# Patient Record
Sex: Female | Born: 1955 | Race: White | Hispanic: No | Smoking: Former smoker
Health system: Southern US, Community
[De-identification: ages and names within clinical notes are randomized; demographics above are authoritative.]

## PROBLEM LIST (undated history)

## (undated) DIAGNOSIS — E079 Disorder of thyroid, unspecified: Secondary | ICD-10-CM

---

## 1991-02-04 HISTORY — PX: ABDOMINAL HYSTERECTOMY: SHX81

## 2008-10-04 ENCOUNTER — Emergency Department (HOSPITAL_COMMUNITY): Admission: EM | Admit: 2008-10-04 | Discharge: 2008-10-04 | Payer: Self-pay | Admitting: Emergency Medicine

## 2010-02-11 ENCOUNTER — Emergency Department (HOSPITAL_BASED_OUTPATIENT_CLINIC_OR_DEPARTMENT_OTHER)
Admission: EM | Admit: 2010-02-11 | Discharge: 2010-02-11 | Payer: Self-pay | Source: Home / Self Care | Admitting: Emergency Medicine

## 2010-05-10 LAB — DIFFERENTIAL
Basophils Absolute: 0 10*3/uL (ref 0.0–0.1)
Basophils Relative: 0 % (ref 0–1)
Eosinophils Absolute: 0 10*3/uL (ref 0.0–0.7)
Eosinophils Relative: 1 % (ref 0–5)

## 2010-05-10 LAB — CBC
HCT: 34.7 % — ABNORMAL LOW (ref 36.0–46.0)
MCV: 89.8 fL (ref 78.0–100.0)
Platelets: 258 10*3/uL (ref 150–400)
RDW: 13.5 % (ref 11.5–15.5)

## 2010-05-10 LAB — POCT I-STAT, CHEM 8
Hemoglobin: 11.9 g/dL — ABNORMAL LOW (ref 12.0–15.0)
Sodium: 141 mEq/L (ref 135–145)
TCO2: 22 mmol/L (ref 0–100)

## 2013-06-29 ENCOUNTER — Emergency Department (HOSPITAL_COMMUNITY)
Admission: EM | Admit: 2013-06-29 | Discharge: 2013-06-30 | Disposition: A | Discharge status: Home / Self Care | Payer: BC Managed Care – PPO | Attending: Emergency Medicine | Admitting: Emergency Medicine

## 2013-06-29 ENCOUNTER — Emergency Department (HOSPITAL_COMMUNITY): Payer: BC Managed Care – PPO

## 2013-06-29 ENCOUNTER — Encounter (HOSPITAL_COMMUNITY): Payer: Self-pay | Admitting: Emergency Medicine

## 2013-06-29 DIAGNOSIS — S63509A Unspecified sprain of unspecified wrist, initial encounter: Secondary | ICD-10-CM | POA: Insufficient documentation

## 2013-06-29 DIAGNOSIS — Y990 Civilian activity done for income or pay: Secondary | ICD-10-CM | POA: Insufficient documentation

## 2013-06-29 DIAGNOSIS — Y939 Activity, unspecified: Secondary | ICD-10-CM | POA: Insufficient documentation

## 2013-06-29 DIAGNOSIS — Z862 Personal history of diseases of the blood and blood-forming organs and certain disorders involving the immune mechanism: Secondary | ICD-10-CM | POA: Insufficient documentation

## 2013-06-29 DIAGNOSIS — Z87891 Personal history of nicotine dependence: Secondary | ICD-10-CM | POA: Insufficient documentation

## 2013-06-29 DIAGNOSIS — Y92009 Unspecified place in unspecified non-institutional (private) residence as the place of occurrence of the external cause: Secondary | ICD-10-CM | POA: Insufficient documentation

## 2013-06-29 DIAGNOSIS — R296 Repeated falls: Secondary | ICD-10-CM | POA: Insufficient documentation

## 2013-06-29 DIAGNOSIS — Z8639 Personal history of other endocrine, nutritional and metabolic disease: Secondary | ICD-10-CM | POA: Insufficient documentation

## 2013-06-29 HISTORY — DX: Disorder of thyroid, unspecified: E07.9

## 2013-06-29 NOTE — ED Notes (Addendum)
Pt reports that she was working in the yard and fell to the ground catching herself with her L hand and now has pain to L wrist area. Minimal swelling noted to area. Pulses normal and skin warm to touch. Pt alert and ambulatory to triage area. Pt with ice pack in place. Pt reports pain that goes up to L elbow.

## 2013-06-29 NOTE — ED Provider Notes (Signed)
CSN: [id number]     Arrival date & time 06/29/13  2100 History   First MD Initiated Contact with Patient 06/29/13 2226 This chart was scribed for non-physician practitioner Roxy Horseman, PA-C working with Shon Baton, MD by Valera Castle, ED scribe. This patient was seen in room [facility]TR2/[facility]PT2 and the patient's care was started at 10:30 PM.     Chief Complaint  Patient presents with  . Wrist Pain   (Consider location/radiation/quality/duration/timing/severity/associated sxs/prior Treatment) The history is provided by the patient. No language interpreter was used.   HPI Comments: Tara Peterson is a 58 y.o. female who presents to the Emergency Department with a chief complaint of gradually worsening, constant, left wrist pain, swelling, and stiffness, onset earlier this evening after working in her yard, falling backwards and catching herself with her left arm. She reports some soreness to her wrist initially, but states she was able to finish working. After taking a shower and sitting down to relax, she noticed the pain and swelling starting to increase significantly. She also reports associated numbness and tingling to her wrist. She reports that movement of her wrist sends shooting pain up her left forearm. She denies any wounds, LOC, and any other associated symptoms.   PCP - Lorenda Peck, MD  Past Medical History  Diagnosis Date  . Thyroid disease    Past Surgical History  Procedure Laterality Date  . Abdominal hysterectomy  1993   History reviewed. No pertinent family history. History  Substance Use Topics  . Smoking status: Former Games developer  . Smokeless tobacco: Former Neurosurgeon    Quit date: 06/29/2008  . Alcohol Use: No   OB History   Grav Para Term Preterm Abortions TAB SAB Ect Mult Living                 Review of Systems  Musculoskeletal: Positive for arthralgias (L wrist with pain and stiffness) and joint swelling (L wrist).  Skin: Negative for wound.   Neurological: Positive for numbness (and tingling to left wrist). Negative for syncope.   Allergies  Review of patient's allergies indicates not on file.  Home Medications   Prior to Admission medications   Not on File   BP 128/79  Pulse 89  Temp(Src) 98.3 F (36.8 C) (Oral)  Resp 20  Ht 5\' 7"  (1.702 m)  Wt 208 lb (94.348 kg)  BMI 32.57 kg/m2  SpO2 97% Physical Exam  Nursing note and vitals reviewed. Constitutional: She is oriented to person, place, and time. She appears well-developed and well-nourished. No distress.  HENT:  Head: Normocephalic and atraumatic.  Eyes: EOM are normal.  Neck: Neck supple. No tracheal deviation present.  Cardiovascular: Normal rate and intact distal pulses.   Intact distal pulses. Brisk capillary refill.   Pulmonary/Chest: Effort normal. No respiratory distress.  Musculoskeletal: Normal range of motion.  Left wrist tender to palpation, no bony abnormality or deformity, ROM and strength reduced secondary to pain. Mild snuff box tenderness.  Neurological: She is alert and oriented to person, place, and time.  Sensation intact  Skin: Skin is warm and dry.  Psychiatric: She has a normal mood and affect. Her behavior is normal.    ED Course  Procedures (including critical care time)  DIAGNOSTIC STUDIES: Oxygen Saturation is 97% on room air, normal by my interpretation.    COORDINATION OF CARE: 10:34 PM-Discussed treatment plan with pt at bedside and pt agreed to plan.   Dg Wrist Complete Left  06/29/2013   CLINICAL  DATA:  58 year old female status post fall with pain. Initial encounter.  EXAM: LEFT WRIST - COMPLETE 3+ VIEW  COMPARISON:  06/29/2013 hand series reported separately.  FINDINGS: Bone mineralization is within normal limits. Distal left radius and ulna intact. Carpal bone alignment within normal limits. Scaphoid appears intact. Metacarpals intact.  IMPRESSION: No acute fracture or dislocation identified about the left wrist.    Electronically Signed   By: Augusto GambleLee  Hall M.D.   On: 06/29/2013 23:25   Dg Hand Complete Left  06/29/2013   CLINICAL DATA:  58 year old female status post fall with pain.  EXAM: LEFT HAND - COMPLETE 3+ VIEW  COMPARISON:  Left wrist series reported separately.  FINDINGS: Bone mineralization is within normal limits. Distal radius and ulna appear intact. Carpal bone alignment within normal limits. Metacarpals and phalanges intact.  IMPRESSION: No acute fracture or dislocation identified about the left hand.   Electronically Signed   By: Augusto GambleLee  Hall M.D.   On: 06/29/2013 23:27    EKG Interpretation None     Medications - No data to display MDM   Final diagnoses:  Wrist sprain    Patient with wrist sprain.  Plain films are negative.  DC to home with hand follow-up.  I personally performed the services described in this documentation, which was scribed in my presence. The recorded information has been reviewed and is accurate.     Roxy Horsemanobert Kemaya Dorner, PA-C 06/30/13 [phone]

## 2013-06-30 MED ORDER — HYDROCODONE-ACETAMINOPHEN 5-325 MG PO TABS: 1.0000 | ORAL_TABLET | Freq: Four times a day (QID) | ORAL | Status: DC | PRN

## 2013-06-30 NOTE — ED Provider Notes (Signed)
Medical screening examination/treatment/procedure(s) were performed by non-physician practitioner and as supervising physician I was immediately available for consultation/collaboration.   EKG Interpretation None       Courtney F Horton, MD 06/30/13 0902 

## 2013-06-30 NOTE — Discharge Instructions (Signed)
Wrist Sprain °with Rehab °A sprain is an injury in which a ligament that maintains the proper alignment of a joint is partially or completely torn. The ligaments of the wrist are susceptible to sprains. Sprains are classified into three categories. Grade 1 sprains cause pain, but the tendon is not lengthened. Grade 2 sprains include a lengthened ligament because the ligament is stretched or partially ruptured. With grade 2 sprains there is still function, although the function may be diminished. Grade 3 sprains are characterized by a complete tear of the tendon or muscle, and function is usually impaired. °SYMPTOMS  °· Pain tenderness, inflammation, and/or bruising (contusion) of the injury. °· A "pop" or tear felt and/or heard at the time of injury. °· Decreased wrist function. °CAUSES  °A wrist sprain occurs when a force is placed on one or more ligaments that is greater than it/they can withstand. Common mechanisms of injury include: °· Catching a ball with you hands. °· Repetitive and/ or strenuous extension or flexion of the wrist. °RISK INCREASES WITH: °· Previous wrist injury. °· Contact sports (boxing or wrestling). °· Activities in which falling is common. °· Poor strength and flexibility. °· Improperly fitted or padded protective equipment. °PREVENTION °· Warm up and stretch properly before activity. °· Allow for adequate recovery between workouts. °· Maintain physical fitness: °· Strength, flexibility, and endurance. °· Cardiovascular fitness. °· Protect the wrist joint by limiting its motion with the use of taping, braces, or splints. °· Protect the wrist after injury for 6 to 12 months. °PROGNOSIS  °The prognosis for wrist sprains depends on the degree of injury. Grade 1 sprains require 2 to 6 weeks of treatment. Grade 2 sprains require 6 to 8 weeks of treatment, and grade 3 sprains require up to 12 weeks.  °RELATED COMPLICATIONS  °· Prolonged healing time, if improperly treated or  re-injured. °· Recurrent symptoms that result in a chronic problem. °· Injury to nearby structures (bone, cartilage, nerves, or tendons). °· Arthritis of the wrist. °· Inability to compete in athletics at a high level. °· Wrist stiffness or weakness. °· Progression to a complete rupture of the ligament. °TREATMENT  °Treatment initially involves resting from any activities that aggravate the symptoms, and the use of ice and medications to help reduce pain and inflammation. Your caregiver may recommend immobilizing the wrist for a period of time in order to reduce stress on the ligament and allow for healing. After immobilization it is important to perform strengthening and stretching exercises to help regain strength and a full range of motion. These exercises may be completed at home or with a therapist. Surgery is not usually required for wrist sprains, unless the ligament has been ruptured (grade 3 sprain). °MEDICATION  °· If pain medication is necessary, then nonsteroidal anti-inflammatory medications, such as aspirin and ibuprofen, or other minor pain relievers, such as acetaminophen, are often recommended. °· Do not take pain medication for 7 days before surgery. °· Prescription pain relievers may be given if deemed necessary by your caregiver. Use only as directed and only as much as you need. °HEAT AND COLD °· Cold treatment (icing) relieves pain and reduces inflammation. Cold treatment should be applied for 10 to 15 minutes every 2 to 3 hours for inflammation and pain and immediately after any activity that aggravates your symptoms. Use ice packs or massage the area with a piece of ice (ice massage). °· Heat treatment may be used prior to performing the stretching and strengthening activities prescribed by your   caregiver, physical therapist, or athletic trainer. Use a heat pack or soak your injury in warm water. °SEEK MEDICAL CARE IF: °· Treatment seems to offer no benefit, or the condition worsens. °· Any  medications produce adverse side effects. °EXERCISES °RANGE OF MOTION (ROM) AND STRETCHING EXERCISES - Wrist Sprain  °These exercises may help you when beginning to rehabilitate your injury. Your symptoms may resolve with or without further involvement from your physician, physical therapist or athletic trainer. While completing these exercises, remember:  °· Restoring tissue flexibility helps normal motion to return to the joints. This allows healthier, less painful movement and activity. °· An effective stretch should be held for at least 30 seconds. °· A stretch should never be painful. You should only feel a gentle lengthening or release in the stretched tissue. °RANGE OF MOTION  Wrist Flexion, Active-Assisted °· Extend your right / left elbow with your fingers pointing down.* °· Gently pull the back of your hand towards you until you feel a gentle stretch on the top of your forearm. °· Hold this position for __________ seconds. °Repeat __________ times. Complete this exercise __________ times per day.  °*If directed by your physician, physical therapist or athletic trainer, complete this stretch with your elbow bent rather than extended. °RANGE OF MOTION  Wrist Extension, Active-Assisted °· Extend your right / left elbow and turn your palm upwards.* °· Gently pull your palm/fingertips back so your wrist extends and your fingers point more toward the ground. °· You should feel a gentle stretch on the inside of your forearm. °· Hold this position for __________ seconds. °Repeat __________ times. Complete this exercise __________ times per day. °*If directed by your physician, physical therapist or athletic trainer, complete this stretch with your elbow bent, rather than extended. °RANGE OF MOTION  Supination, Active °· Stand or sit with your elbows at your side. Bend your right / left elbow to 90 degrees. °· Turn your palm upward until you feel a gentle stretch on the inside of your forearm. °· Hold this position  for __________ seconds. Slowly release and return to the starting position. °Repeat __________ times. Complete this stretch __________ times per day.  °RANGE OF MOTION  Pronation, Active °· Stand or sit with your elbows at your side. Bend your right / left elbow to 90 degrees. °· Turn your palm downward until you feel a gentle stretch on the top of your forearm. °· Hold this position for __________ seconds. Slowly release and return to the starting position. °Repeat __________ times. Complete this stretch __________ times per day.  °STRETCH - Wrist Flexion °· Place the back of your right / left hand on a tabletop leaving your elbow slightly bent. Your fingers should point away from your body. °· Gently press the back of your hand down onto the table by straightening your elbow. You should feel a stretch on the top of your forearm. °· Hold this position for __________ seconds. °Repeat __________ times. Complete this stretch __________ times per day.  °STRETCH  Wrist Extension °· Place your right / left fingertips on a tabletop leaving your elbow slightly bent. Your fingers should point backwards. °· Gently press your fingers and palm down onto the table by straightening your elbow. You should feel a stretch on the inside of your forearm. °· Hold this position for __________ seconds. °Repeat __________ times. Complete this stretch __________ times per day.  °STRENGTHENING EXERCISES - Wrist Sprain °These exercises may help you when beginning to rehabilitate your injury.   They may resolve your symptoms with or without further involvement from your physician, physical therapist or athletic trainer. While completing these exercises, remember:  °· Muscles can gain both the endurance and the strength needed for everyday activities through controlled exercises. °· Complete these exercises as instructed by your physician, physical therapist or athletic trainer. Progress with the resistance and repetition exercises only as your  caregiver advises. °STRENGTH Wrist Flexors °· Sit with your right / left forearm palm-up and fully supported. Your elbow should be resting below the height of your shoulder. Allow your wrist to extend over the edge of the surface. °· Loosely holding a __________ weight or a piece of rubber exercise band/tubing, slowly curl your hand up toward your forearm. °· Hold this position for __________ seconds. Slowly lower the wrist back to the starting position in a controlled manner. °Repeat __________ times. Complete this exercise __________ times per day.  °STRENGTH  Wrist Extensors °· Sit with your right / left forearm palm-down and fully supported. Your elbow should be resting below the height of your shoulder. Allow your wrist to extend over the edge of the surface. °· Loosely holding a __________ weight or a piece of rubber exercise band/tubing, slowly curl your hand up toward your forearm. °· Hold this position for __________ seconds. Slowly lower the wrist back to the starting position in a controlled manner. °Repeat __________ times. Complete this exercise __________ times per day.  °STRENGTH - Ulnar Deviators °· Stand with a ____________________ weight in your right / left hand, or sit holding on to the rubber exercise band/tubing with your opposite arm supported. °· Move your wrist so that your pinkie travels toward your forearm and your thumb moves away from your forearm. °· Hold this position for __________ seconds and then slowly lower the wrist back to the starting position. °Repeat __________ times. Complete this exercise __________ times per day °STRENGTH - Radial Deviators °· Stand with a ____________________ weight in your °· right / left hand, or sit holding on to the rubber exercise band/tubing with your arm supported. °· Raise your hand upward in front of you or pull up on the rubber tubing. °· Hold this position for __________ seconds and then slowly lower the wrist back to the starting  position. °Repeat __________ times. Complete this exercise __________ times per day. °STRENGTH  Forearm Supinators °· Sit with your right / left forearm supported on a table, keeping your elbow below shoulder height. Rest your hand over the edge, palm down. °· Gently grip a hammer or a soup ladle. °· Without moving your elbow, slowly turn your palm and hand upward to a "thumbs-up" position. °· Hold this position for __________ seconds. Slowly return to the starting position. °Repeat __________ times. Complete this exercise __________ times per day.  °STRENGTH  Forearm Pronators °· Sit with your right / left forearm supported on a table, keeping your elbow below shoulder height. Rest your hand over the edge, palm up. °· Gently grip a hammer or a soup ladle. °· Without moving your elbow, slowly turn your palm and hand upward to a "thumbs-up" position. °· Hold this position for __________ seconds. Slowly return to the starting position. °Repeat __________ times. Complete this exercise __________ times per day.  °STRENGTH - Grip °· Grasp a tennis ball, a dense sponge, or a large, rolled sock in your hand. °· Squeeze as hard as you can without increasing any pain. °· Hold this position for __________ seconds. Release your grip slowly. °Repeat   __________ times. Complete this exercise __________ times per day.  °Document Released: 01/20/2005 Document Revised: 04/14/2011 Document Reviewed: 05/04/2008 °[employer]® Patient Information ©2014 [employer]. ° °

## 2018-01-18 ENCOUNTER — Other Ambulatory Visit: Payer: Self-pay

## 2018-01-18 ENCOUNTER — Encounter (HOSPITAL_COMMUNITY): Payer: Self-pay | Admitting: Emergency Medicine

## 2018-01-18 ENCOUNTER — Emergency Department (HOSPITAL_COMMUNITY): Payer: BLUE CROSS/BLUE SHIELD

## 2018-01-18 ENCOUNTER — Emergency Department (HOSPITAL_COMMUNITY)
Admission: EM | Admit: 2018-01-18 | Discharge: 2018-01-18 | Disposition: A | Discharge status: Home / Self Care | Payer: BLUE CROSS/BLUE SHIELD | Attending: Emergency Medicine | Admitting: Emergency Medicine

## 2018-01-18 DIAGNOSIS — Z87891 Personal history of nicotine dependence: Secondary | ICD-10-CM | POA: Insufficient documentation

## 2018-01-18 DIAGNOSIS — R1013 Epigastric pain: Secondary | ICD-10-CM | POA: Diagnosis not present

## 2018-01-18 LAB — COMPREHENSIVE METABOLIC PANEL
ALK PHOS: 51 U/L (ref 38–126)
ALT: 20 U/L (ref 0–44)
ANION GAP: 11 (ref 5–15)
AST: 18 U/L (ref 15–41)
Albumin: 4.3 g/dL (ref 3.5–5.0)
BILIRUBIN TOTAL: 0.5 mg/dL (ref 0.3–1.2)
BUN: 18 mg/dL (ref 8–23)
CALCIUM: 9 mg/dL (ref 8.9–10.3)
CO2: 23 mmol/L (ref 22–32)
CREATININE: 0.83 mg/dL (ref 0.44–1.00)
Chloride: 103 mmol/L (ref 98–111)
GFR calc non Af Amer: >60 mL/min (ref >60)
Glucose, Bld: 115 mg/dL — ABNORMAL HIGH (ref 70–99)
Potassium: 3.7 mmol/L (ref 3.5–5.1)
Sodium: 137 mmol/L (ref 135–145)
Total Protein: 7.4 g/dL (ref 6.5–8.1)

## 2018-01-18 LAB — CBC WITH DIFFERENTIAL/PLATELET
Abs Immature Granulocytes: 0.02 10*3/uL (ref 0.00–0.07)
BASOS ABS: 0 10*3/uL (ref 0.0–0.1)
Basophils Relative: 0 %
EOS ABS: 0.1 10*3/uL (ref 0.0–0.5)
EOS PCT: 1 %
HEMATOCRIT: 44.7 % (ref 36.0–46.0)
HEMOGLOBIN: 14.1 g/dL (ref 12.0–15.0)
Immature Granulocytes: 0 %
LYMPHS ABS: 2.4 10*3/uL (ref 0.7–4.0)
Lymphocytes Relative: 32 %
MCH: 30.1 pg (ref 26.0–34.0)
MCHC: 31.5 g/dL (ref 30.0–36.0)
MCV: 95.3 fL (ref 80.0–100.0)
MONO ABS: 0.5 10*3/uL (ref 0.1–1.0)
Monocytes Relative: 7 %
NRBC: 0 % (ref 0.0–0.2)
Neutro Abs: 4.7 10*3/uL (ref 1.7–7.7)
Neutrophils Relative %: 60 %
Platelets: 393 10*3/uL (ref 150–400)
RBC: 4.69 MIL/uL (ref 3.87–5.11)
RDW: 13.5 % (ref 11.5–15.5)
WBC: 7.7 10*3/uL (ref 4.0–10.5)

## 2018-01-18 LAB — LIPASE, BLOOD: Lipase: 47 U/L (ref 11–51)

## 2018-01-18 LAB — I-STAT TROPONIN, ED: Troponin i, poc: 0.01 ng/mL (ref 0.00–0.08)

## 2018-01-18 MED ORDER — LIDOCAINE VISCOUS HCL 2 % MT SOLN
15.0000 mL | Freq: Once | OROMUCOSAL | Status: AC
  Administered 2018-01-18: 15 mL via ORAL
  Filled 2018-01-18: qty 15

## 2018-01-18 MED ORDER — HYDROCODONE-ACETAMINOPHEN 5-325 MG PO TABS: 1.0000 | ORAL_TABLET | Freq: Four times a day (QID) | ORAL | 0 refills | Status: DC | PRN

## 2018-01-18 MED ORDER — ALUM & MAG HYDROXIDE-SIMETH 200-200-20 MG/5ML PO SUSP
30.0000 mL | Freq: Once | ORAL | Status: DC
  Filled 2018-01-18: qty 30

## 2018-01-18 MED ORDER — IOHEXOL 300 MG/ML  SOLN
100.0000 mL | Freq: Once | INTRAMUSCULAR | Status: AC
  Administered 2018-01-18: 100 mL via INTRAVENOUS

## 2018-01-18 MED ORDER — ALUM & MAG HYDROXIDE-SIMETH 200-200-20 MG/5ML PO SUSP
30.0000 mL | Freq: Once | ORAL | Status: AC
  Administered 2018-01-18: 30 mL via ORAL

## 2018-01-18 NOTE — ED Provider Notes (Signed)
[facility]   CSN: [id number] Arrival date & time: 01/18/18  0845     History   Chief Complaint Chief Complaint  Patient presents with  . Abdominal Pain    HPI Tara Peterson is a 62 y.o. female.  Patient is a 62 year old female with history of hypothyroidism.  She presents today for evaluation of epigastric pain.  This started this past evening and woke her from sleep.  Her pain is constant and located in the epigastric area.  There is no vomiting, however she has felt nauseous.  She denies any fevers or chills.  She denies any constipation or diarrhea.  The history is provided by the patient.  Abdominal Pain   This is a new problem. Episode onset: early this AM. The problem occurs constantly. The problem has been gradually worsening. The pain is associated with an unknown factor. The pain is located in the epigastric region. The quality of the pain is cramping. The pain is moderate. Pertinent negatives include fever, diarrhea, melena, constipation and dysuria. The symptoms are aggravated by certain positions and palpation. Nothing relieves the symptoms.    Past Medical History:  Diagnosis Date  . Thyroid disease     There are no active problems to display for this patient.   Past Surgical History:  Procedure Laterality Date  . ABDOMINAL HYSTERECTOMY  1993     OB History   No obstetric history on file.      Home Medications    Prior to Admission medications   Medication Sig Start Date End Date Taking? Authorizing Provider  HYDROcodone-acetaminophen (NORCO/VICODIN) 5-325 MG per tablet Take 1-2 tablets by mouth every 6 (six) hours as needed. 06/30/13   Roxy HorsemanBrowning, Robert, PA-C    Family History History reviewed. No pertinent family history.  Social History Social History   Tobacco Use  . Smoking status: Former Games developermoker  . Smokeless tobacco: Former NeurosurgeonUser    Quit date: 06/29/2008  Substance Use Topics  . Alcohol  use: No  . Drug use: No     Allergies   Patient has no known allergies.   Review of Systems Review of Systems  Constitutional: Negative for fever.  Gastrointestinal: Positive for abdominal pain. Negative for constipation, diarrhea and melena.  Genitourinary: Negative for dysuria.  All other systems reviewed and are negative.    Physical Exam Updated Vital Signs BP 117/87 (BP Location: Right Arm)   Pulse 82   Temp 98 F (36.7 C) (Oral)   Resp 15   Ht 5\' 7"  (1.702 m)   Wt 93.4 kg   SpO2 99%   BMI 32.26 kg/m   Physical Exam Vitals signs and nursing note reviewed.  Constitutional:      General: She is not in acute distress.    Appearance: She is well-developed. She is not diaphoretic.  HENT:     Head: Normocephalic and atraumatic.  Neck:     Musculoskeletal: Normal range of motion and neck supple.  Cardiovascular:     Rate and Rhythm: Normal rate and regular rhythm.     Heart sounds: No murmur. No friction rub. No gallop.   Pulmonary:     Effort: Pulmonary effort is normal. No respiratory distress.     Breath sounds: Normal breath sounds. No wheezing.  Abdominal:     General: Bowel sounds are normal. There is no distension.     Palpations: Abdomen is soft.     Tenderness: There is abdominal tenderness in the  epigastric area. There is no left CVA tenderness, guarding or rebound.  Musculoskeletal: Normal range of motion.  Skin:    General: Skin is warm and dry.  Neurological:     Mental Status: She is alert and oriented to person, place, and time.      ED Treatments / Results  Labs (all labs ordered are listed, but only abnormal results are displayed) Labs Reviewed  COMPREHENSIVE METABOLIC PANEL  LIPASE, BLOOD  CBC WITH DIFFERENTIAL/PLATELET  I-STAT TROPONIN, ED    EKG EKG Interpretation  Date/Time:  Monday January 18 2018 09:06:38 EST Ventricular Rate:  84 PR Interval:    QRS Duration: 92 QT Interval:  364 QTC Calculation: 431 R  Axis:   65 Text Interpretation:  Sinus rhythm Nonspecific repol abnormality, diffuse leads Baseline wander in lead(s) II III aVF Confirmed by Geoffery Lyons ([id number]) on 01/18/2018 9:25:22 AM   Radiology No results found.  Procedures Procedures (including critical care time)  Medications Ordered in ED Medications  alum & mag hydroxide-simeth (MAALOX/MYLANTA) 200-200-20 MG/5ML suspension 30 mL (has no administration in time range)    And  lidocaine (XYLOCAINE) 2 % viscous mouth solution 15 mL (has no administration in time range)  alum & mag hydroxide-simeth (MAALOX/MYLANTA) 200-200-20 MG/5ML suspension 30 mL (has no administration in time range)     Initial Impression / Assessment and Plan / ED Course  I have reviewed the triage vital signs and the nursing notes.  Pertinent labs & imaging results that were available during my care of the patient were reviewed by me and considered in my medical decision making (see chart for details).  Patient presents here with complaints of epigastric pain.  This began yesterday evening and is worsening.  She is tender to the epigastric region, however there are no peritoneal signs.  She has no fever, no white count, and normal LFTs and lipase.  An ultrasound was obtained of the right upper quadrant showing no evidence for gallstones or other abnormality.  A CT scan was also obtained which is showing no acute abnormalities of the abdomen or pelvis.  I am uncertain as to the etiology of this patient's pain, however nothing appears emergent.  She declined pain medication while in the ER, but will be discharged with pain medicine and follow up with GI if not improving.  She understands to return to the ER if symptoms worsen.  Final Clinical Impressions(s) / ED Diagnoses   Final diagnoses:  Epigastric pain    ED Discharge Orders    None       Geoffery Lyons, MD 01/18/18 1401

## 2018-01-18 NOTE — ED Notes (Signed)
Offer pt. Something to drink

## 2018-01-18 NOTE — ED Triage Notes (Signed)
Pt reports left upper abdominal since 230am, reports pain worsened about 7am. Pt awake, alert, oriented x4,, NAD at present. Pain 7/10. Reports nausea, denies vomiting, fever, diarrhea.

## 2018-01-18 NOTE — Discharge Instructions (Addendum)
Hydrocodone as prescribed as needed for pain.  Follow-up with gastroenterology if symptoms or not improving in the next 2 to 3 days, and return to the ER if you develop high fever, worsening pain, bloody stool, or other new and concerning symptoms.

## 2018-01-18 NOTE — ED Notes (Signed)
Patient transported to CT 

## 2018-07-13 ENCOUNTER — Other Ambulatory Visit: Payer: Self-pay | Admitting: Chiropractic Medicine

## 2018-07-13 ENCOUNTER — Ambulatory Visit
Admission: RE | Admit: 2018-07-13 | Discharge: 2018-07-13 | Disposition: A | Discharge status: Home / Self Care | Payer: BC Managed Care – PPO | Source: Ambulatory Visit | Attending: Chiropractic Medicine | Admitting: Chiropractic Medicine

## 2018-07-13 ENCOUNTER — Other Ambulatory Visit: Payer: Self-pay

## 2018-07-13 ENCOUNTER — Other Ambulatory Visit: Payer: BLUE CROSS/BLUE SHIELD

## 2018-07-13 DIAGNOSIS — R102 Pelvic and perineal pain: Secondary | ICD-10-CM

## 2018-07-13 DIAGNOSIS — M25561 Pain in right knee: Secondary | ICD-10-CM

## 2018-07-13 DIAGNOSIS — M542 Cervicalgia: Secondary | ICD-10-CM

## 2018-07-13 DIAGNOSIS — M5440 Lumbago with sciatica, unspecified side: Secondary | ICD-10-CM

## 2018-07-13 DIAGNOSIS — M25562 Pain in left knee: Secondary | ICD-10-CM

## 2019-12-16 IMAGING — CR LUMBAR SPINE - COMPLETE 4+ VIEW
4 series · 4 of 4 positions shown · non-contrast
Comparison: CT abdomen and pelvis 01/18/2018.

CLINICAL DATA: Low back pain since 5503.  No known injury.

EXAM:
LUMBAR SPINE - COMPLETE 4+ VIEW

[t lumbar spine ap]
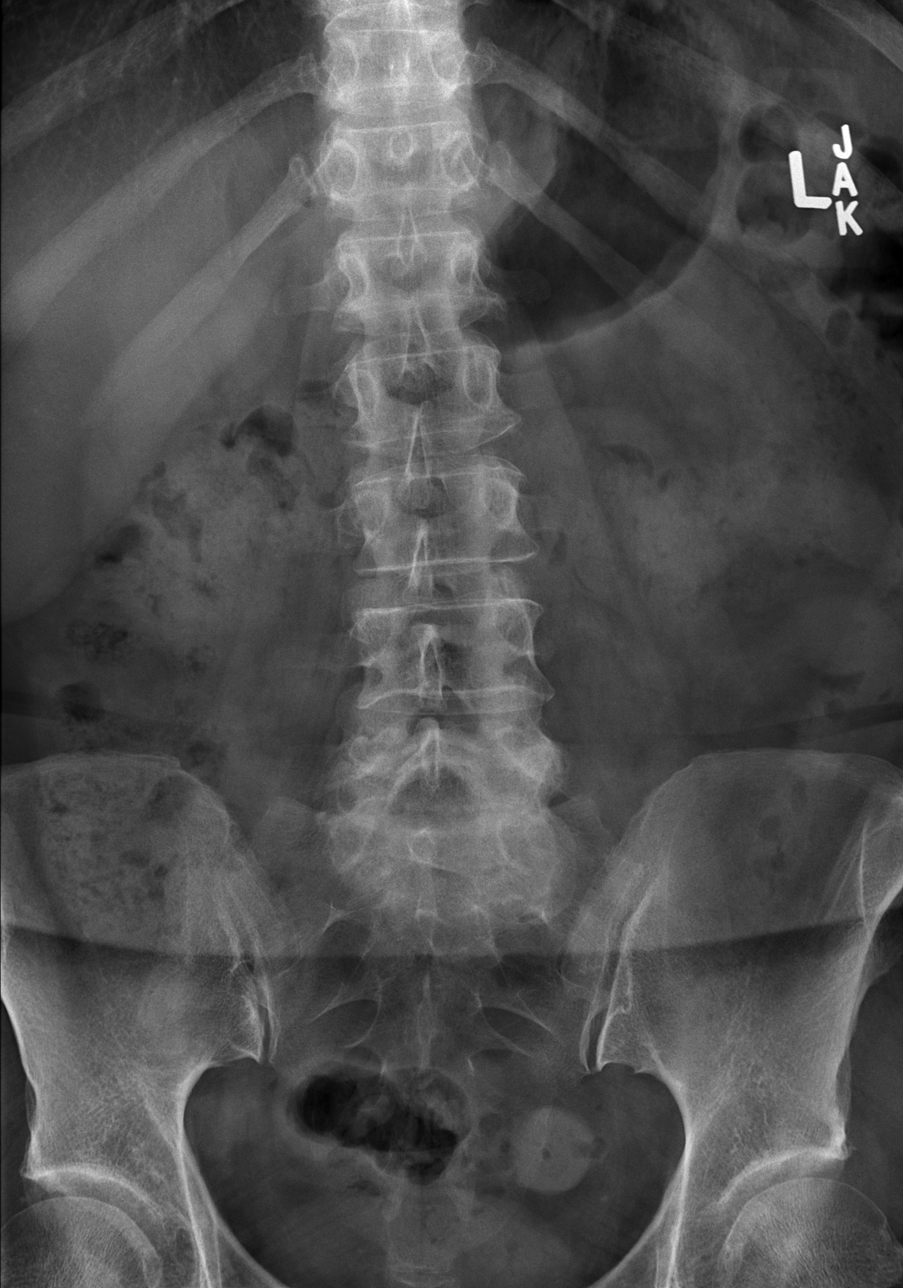

[t lumbar spine lat]
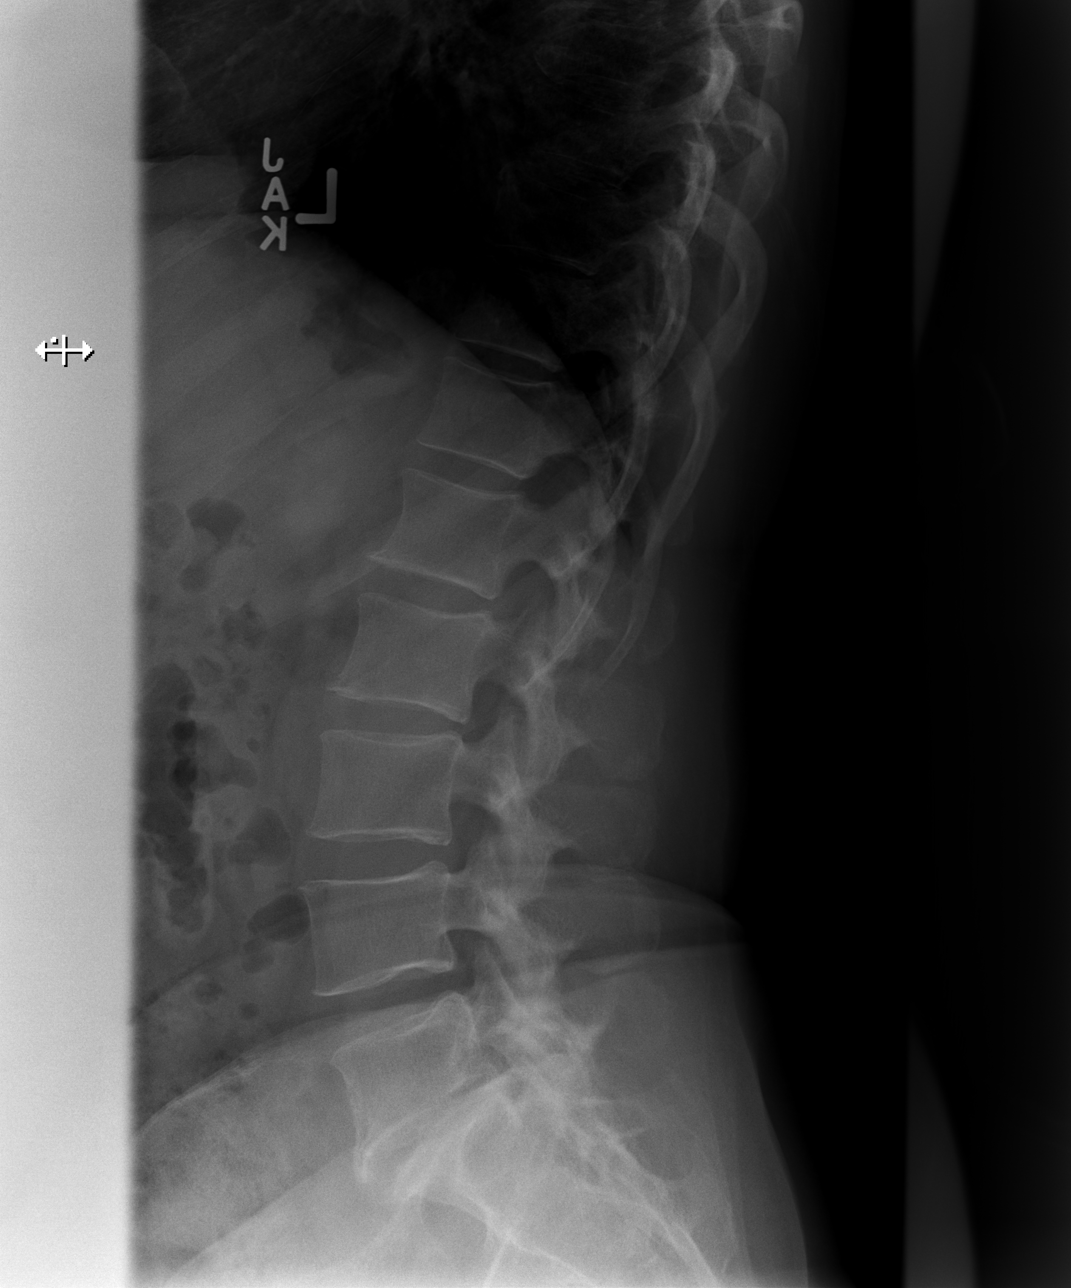

[t lumbar l-5 s-1 spot (1 of 2)]
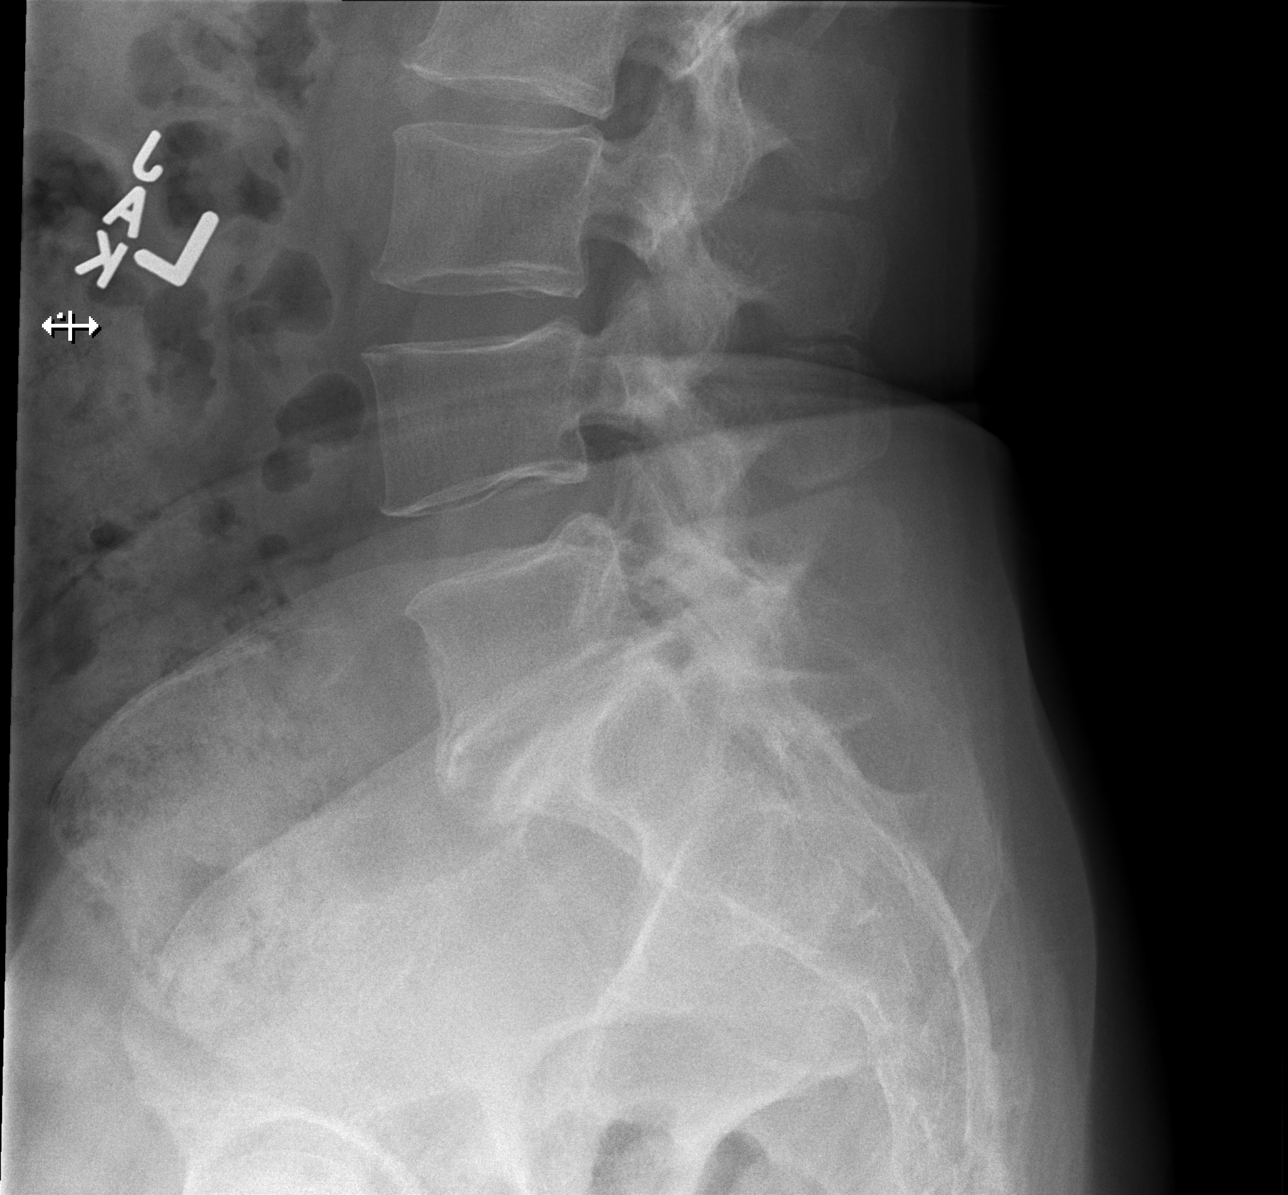

[t lumbar l-5 s-1 spot (2 of 2)]
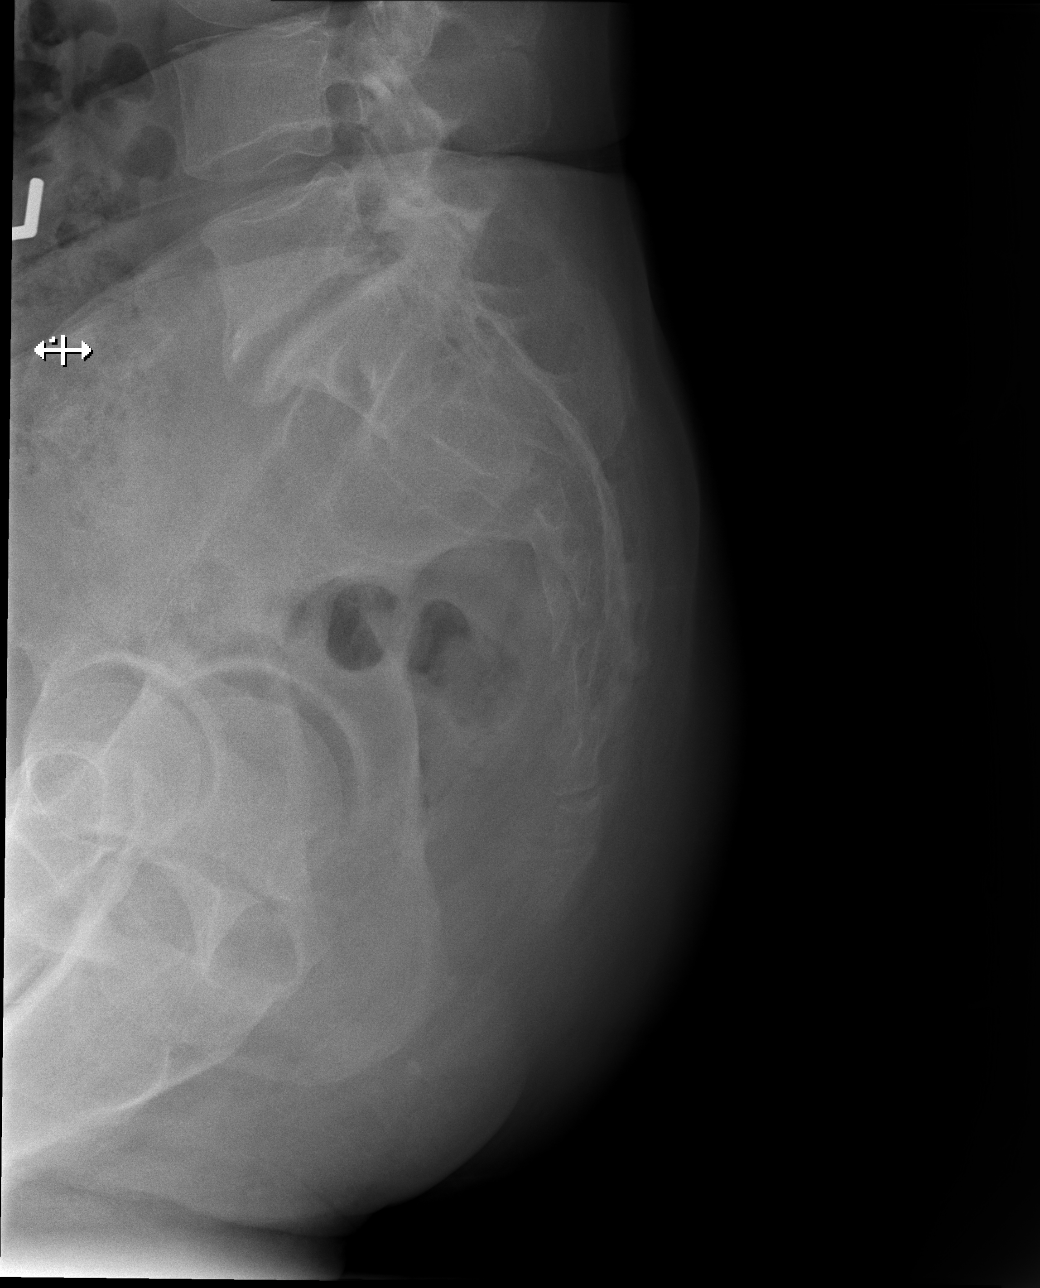

[4 of 4 positions shown; findings below may reference images not displayed]

FINDINGS: Vertebral body height and alignment are maintained. Marked loss of
disc space height at L5-S1 with anterior and posterior endplate
spurring appear unchanged. There is some facet arthropathy at L5-S1.
Intervertebral disc spaces are otherwise normal in appearance.
Paraspinous structures appear normal.
IMPRESSION: No acute finding.

Degenerative disc disease at L5-S1 appears unchanged compared to

## 2019-12-16 IMAGING — CR THORACIC SPINE - 3 VIEWS
3 series · 3 of 3 positions shown · non-contrast
Comparison: None.

CLINICAL DATA: Thoracic back pain for 6 months. No known injury.

EXAM:
THORACIC SPINE - 3 VIEWS

[t thoracic spine ap]
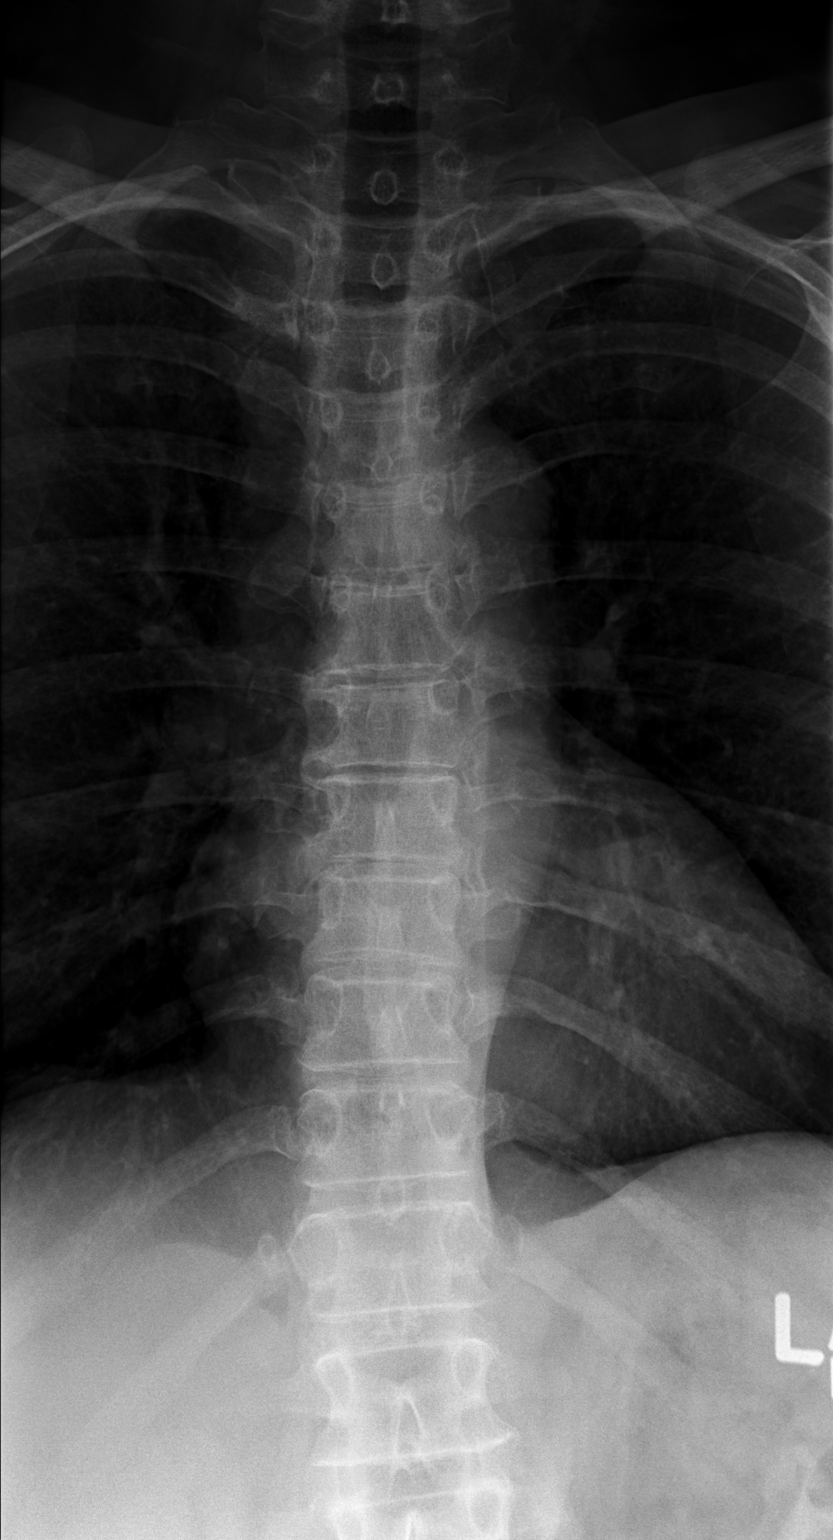

[t thoracic spine lat]
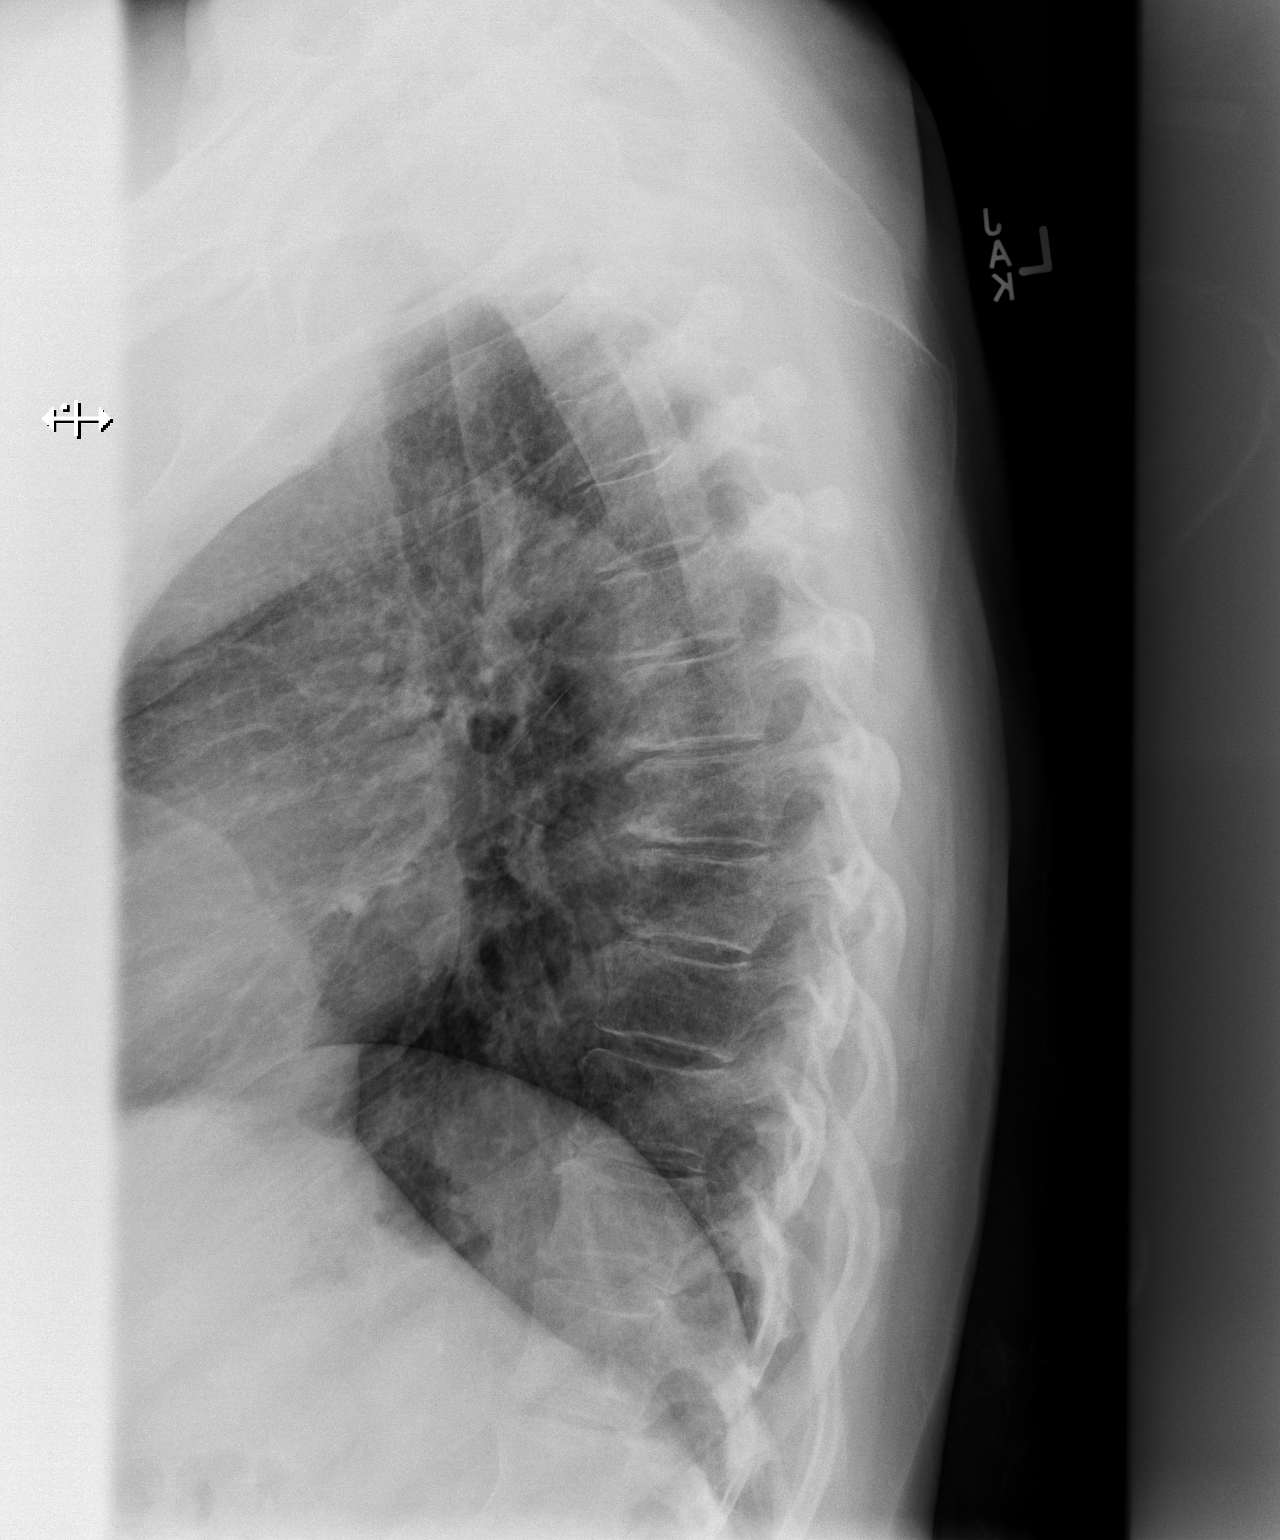

[t thoracic swimmers]
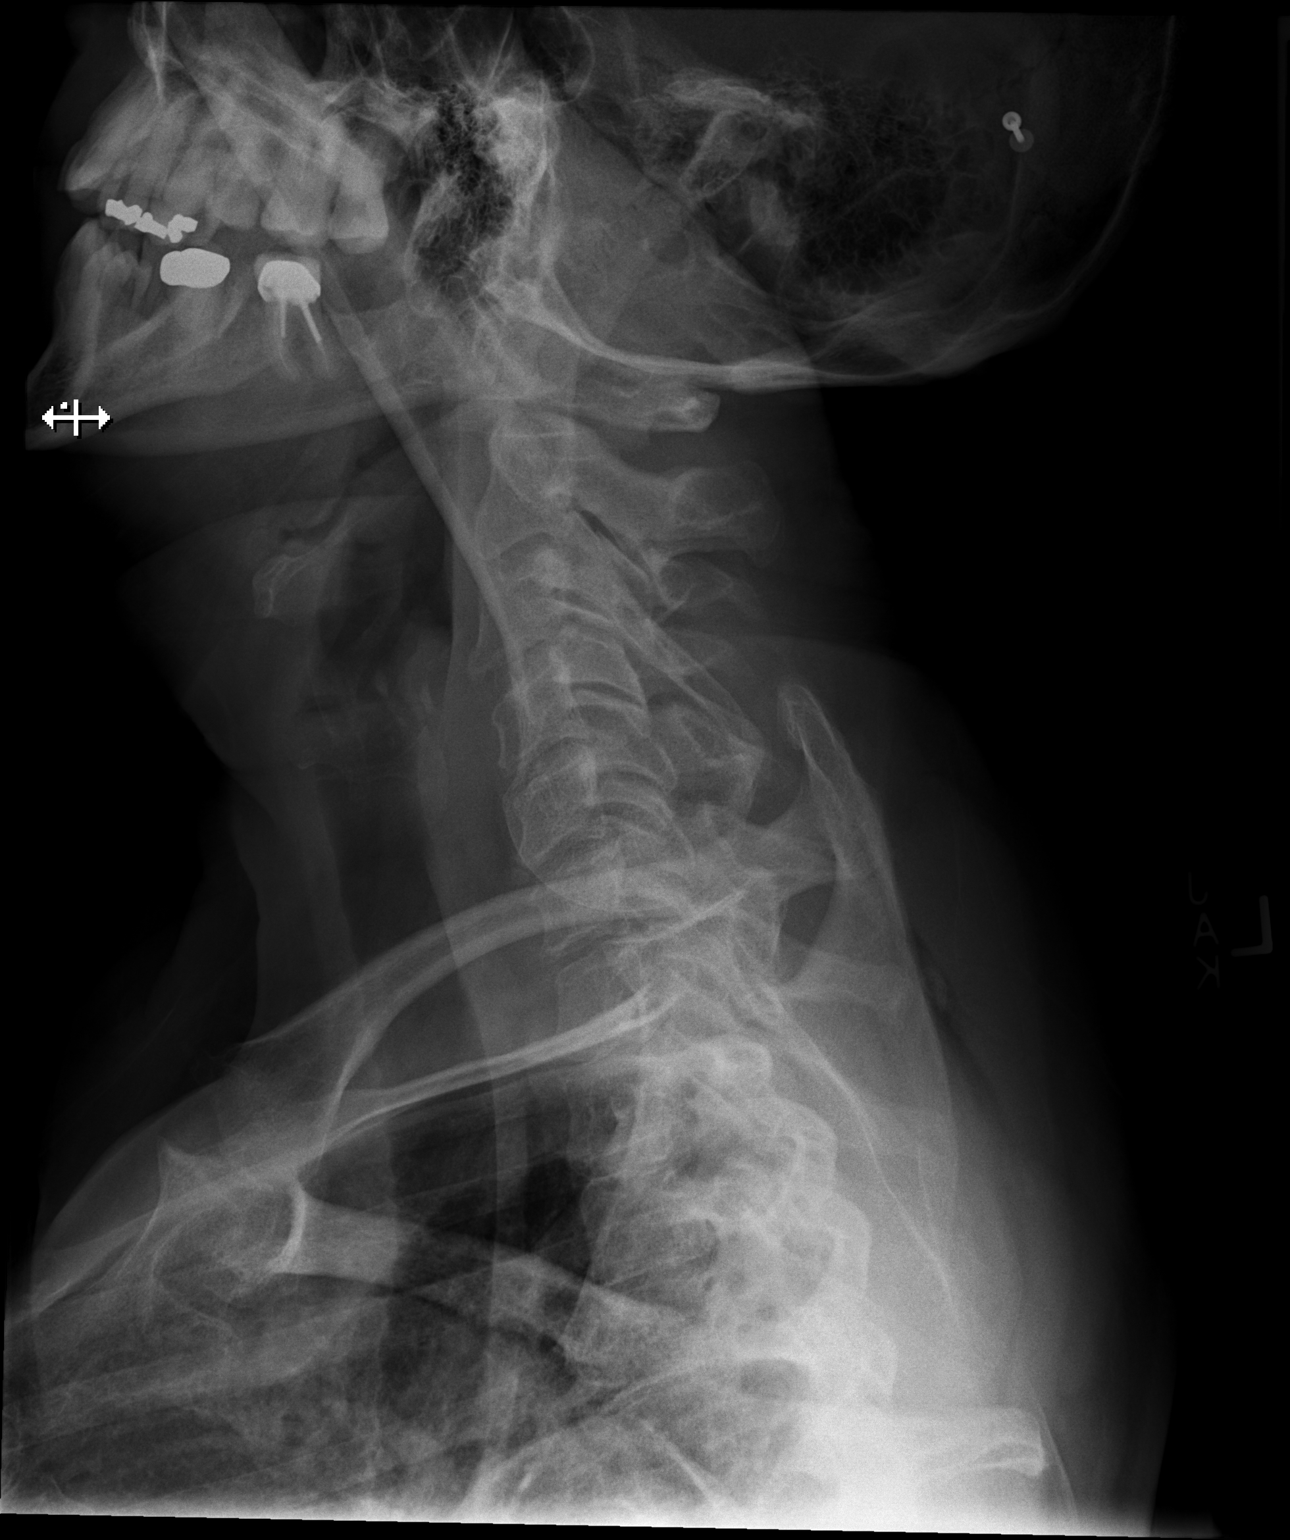

[3 of 3 positions shown; findings below may reference images not displayed]

FINDINGS: There is no evidence of thoracic spine fracture. Alignment is
normal. Mild degenerative disc disease is seen at multiple levels in
the midthoracic spine. No focal lytic or sclerotic bone lesions
identified.
IMPRESSION: No acute findings. Mild midthoracic spine degenerative disc disease.

## 2021-11-14 ENCOUNTER — Encounter (HOSPITAL_COMMUNITY): Payer: Self-pay | Admitting: Certified Registered Nurse Anesthetist

## 2021-11-14 ENCOUNTER — Emergency Department (HOSPITAL_COMMUNITY): Payer: Medicare Other

## 2021-11-14 ENCOUNTER — Other Ambulatory Visit: Payer: Self-pay

## 2021-11-14 ENCOUNTER — Emergency Department (HOSPITAL_COMMUNITY): Payer: Medicare Other | Admitting: Certified Registered"

## 2021-11-14 ENCOUNTER — Emergency Department (HOSPITAL_BASED_OUTPATIENT_CLINIC_OR_DEPARTMENT_OTHER): Payer: Medicare Other | Admitting: Certified Registered"

## 2021-11-14 ENCOUNTER — Encounter (HOSPITAL_COMMUNITY): Admission: EM | Disposition: A | Discharge status: Home / Self Care | Payer: Self-pay | Source: Home / Self Care

## 2021-11-14 ENCOUNTER — Inpatient Hospital Stay (HOSPITAL_COMMUNITY)
Admission: EM | Admit: 2021-11-14 | Discharge: 2021-11-15 | DRG: 399 | Disposition: A | Discharge status: Home / Self Care | Payer: Medicare Other | Attending: Surgery | Admitting: Surgery

## 2021-11-14 DIAGNOSIS — K3580 Unspecified acute appendicitis: Secondary | ICD-10-CM | POA: Diagnosis not present

## 2021-11-14 DIAGNOSIS — Z90710 Acquired absence of both cervix and uterus: Secondary | ICD-10-CM | POA: Diagnosis not present

## 2021-11-14 DIAGNOSIS — E669 Obesity, unspecified: Secondary | ICD-10-CM | POA: Diagnosis present

## 2021-11-14 DIAGNOSIS — Z9049 Acquired absence of other specified parts of digestive tract: Secondary | ICD-10-CM

## 2021-11-14 DIAGNOSIS — Z87891 Personal history of nicotine dependence: Secondary | ICD-10-CM | POA: Diagnosis not present

## 2021-11-14 DIAGNOSIS — K3589 Other acute appendicitis without perforation or gangrene: Secondary | ICD-10-CM | POA: Diagnosis not present

## 2021-11-14 DIAGNOSIS — Z6836 Body mass index (BMI) 36.0-36.9, adult: Secondary | ICD-10-CM | POA: Diagnosis not present

## 2021-11-14 DIAGNOSIS — J302 Other seasonal allergic rhinitis: Secondary | ICD-10-CM | POA: Diagnosis not present

## 2021-11-14 DIAGNOSIS — R1084 Generalized abdominal pain: Secondary | ICD-10-CM | POA: Diagnosis present

## 2021-11-14 HISTORY — PX: LAPAROSCOPIC APPENDECTOMY: SHX408

## 2021-11-14 LAB — COMPREHENSIVE METABOLIC PANEL
ALT: 25 U/L (ref 0–44)
AST: 20 U/L (ref 15–41)
Albumin: 4.2 g/dL (ref 3.5–5.0)
Alkaline Phosphatase: 57 U/L (ref 38–126)
Anion gap: 9 (ref 5–15)
BUN: 12 mg/dL (ref 8–23)
CO2: 21 mmol/L — ABNORMAL LOW (ref 22–32)
Calcium: 9.2 mg/dL (ref 8.9–10.3)
Chloride: 107 mmol/L (ref 98–111)
Creatinine, Ser: 0.84 mg/dL (ref 0.44–1.00)
GFR, Estimated: >60 mL/min (ref >60)
Glucose, Bld: 127 mg/dL — ABNORMAL HIGH (ref 70–99)
Potassium: 4.3 mmol/L (ref 3.5–5.1)
Sodium: 137 mmol/L (ref 135–145)
Total Bilirubin: 0.6 mg/dL (ref 0.3–1.2)
Total Protein: 7.2 g/dL (ref 6.5–8.1)

## 2021-11-14 LAB — URINALYSIS, ROUTINE W REFLEX MICROSCOPIC
Bilirubin Urine: NEGATIVE
Glucose, UA: NEGATIVE mg/dL
Hgb urine dipstick: NEGATIVE
Ketones, ur: NEGATIVE mg/dL
Leukocytes,Ua: NEGATIVE
Nitrite: NEGATIVE
Protein, ur: NEGATIVE mg/dL
Specific Gravity, Urine: 1.018 (ref 1.005–1.030)
pH: 6 (ref 5.0–8.0)

## 2021-11-14 LAB — CBC WITH DIFFERENTIAL/PLATELET
Abs Immature Granulocytes: 0.23 10*3/uL — ABNORMAL HIGH (ref 0.00–0.07)
Basophils Absolute: 0 10*3/uL (ref 0.0–0.1)
Basophils Relative: 0 %
Eosinophils Absolute: 0 10*3/uL (ref 0.0–0.5)
Eosinophils Relative: 0 %
HCT: 38.6 % (ref 36.0–46.0)
Hemoglobin: 12.9 g/dL (ref 12.0–15.0)
Immature Granulocytes: 2 %
Lymphocytes Relative: 11 %
Lymphs Abs: 1.5 10*3/uL (ref 0.7–4.0)
MCH: 31.4 pg (ref 26.0–34.0)
MCHC: 33.4 g/dL (ref 30.0–36.0)
MCV: 93.9 fL (ref 80.0–100.0)
Monocytes Absolute: 0.8 10*3/uL (ref 0.1–1.0)
Monocytes Relative: 6 %
Neutro Abs: 11.8 10*3/uL — ABNORMAL HIGH (ref 1.7–7.7)
Neutrophils Relative %: 81 %
Platelets: 287 10*3/uL (ref 150–400)
RBC: 4.11 MIL/uL (ref 3.87–5.11)
RDW: 13.4 % (ref 11.5–15.5)
WBC: 14.5 10*3/uL — ABNORMAL HIGH (ref 4.0–10.5)
nRBC: 0 % (ref 0.0–0.2)

## 2021-11-14 LAB — LIPASE, BLOOD: Lipase: 32 U/L (ref 11–51)

## 2021-11-14 SURGERY — APPENDECTOMY, LAPAROSCOPIC
Anesthesia: General | Site: Abdomen

## 2021-11-14 MED ORDER — SODIUM CHLORIDE 0.9 % IV SOLN: INTRAVENOUS | Status: DC

## 2021-11-14 MED ORDER — ONDANSETRON HCL 4 MG/2ML IJ SOLN
INTRAMUSCULAR | Status: DC | PRN
  Administered 2021-11-14: 4 mg via INTRAVENOUS

## 2021-11-14 MED ORDER — METHOCARBAMOL 500 MG PO TABS
500.0000 mg | ORAL_TABLET | Freq: Three times a day (TID) | ORAL | Status: DC | PRN
  Administered 2021-11-15: 500 mg via ORAL
  Filled 2021-11-14: qty 1

## 2021-11-14 MED ORDER — ACETAMINOPHEN 325 MG PO TABS: 325.0000 mg | ORAL_TABLET | ORAL | Status: DC | PRN

## 2021-11-14 MED ORDER — ALBUTEROL SULFATE (2.5 MG/3ML) 0.083% IN NEBU
INHALATION_SOLUTION | RESPIRATORY_TRACT | Status: AC
  Administered 2021-11-14: 2.5 mg via RESPIRATORY_TRACT
  Filled 2021-11-14: qty 3

## 2021-11-14 MED ORDER — SUCCINYLCHOLINE CHLORIDE 200 MG/10ML IV SOSY
PREFILLED_SYRINGE | INTRAVENOUS | Status: AC
  Filled 2021-11-14: qty 10

## 2021-11-14 MED ORDER — SUCCINYLCHOLINE CHLORIDE 200 MG/10ML IV SOSY
PREFILLED_SYRINGE | INTRAVENOUS | Status: DC | PRN
  Administered 2021-11-14: 140 mg via INTRAVENOUS

## 2021-11-14 MED ORDER — ALBUTEROL SULFATE HFA 108 (90 BASE) MCG/ACT IN AERS
INHALATION_SPRAY | RESPIRATORY_TRACT | Status: AC
  Filled 2021-11-14: qty 6.7

## 2021-11-14 MED ORDER — SUGAMMADEX SODIUM 200 MG/2ML IV SOLN
INTRAVENOUS | Status: DC | PRN
  Administered 2021-11-14: 200 mg via INTRAVENOUS

## 2021-11-14 MED ORDER — MIDAZOLAM HCL 2 MG/2ML IJ SOLN
INTRAMUSCULAR | Status: DC | PRN
  Administered 2021-11-14: 2 mg via INTRAVENOUS

## 2021-11-14 MED ORDER — LIDOCAINE 2% (20 MG/ML) 5 ML SYRINGE
INTRAMUSCULAR | Status: DC | PRN
  Administered 2021-11-14: 40 mg via INTRAVENOUS

## 2021-11-14 MED ORDER — ONDANSETRON HCL 4 MG/2ML IJ SOLN: 4.0000 mg | Freq: Four times a day (QID) | INTRAMUSCULAR | Status: DC | PRN

## 2021-11-14 MED ORDER — PROMETHAZINE HCL 25 MG/ML IJ SOLN: 6.2500 mg | INTRAMUSCULAR | Status: DC | PRN

## 2021-11-14 MED ORDER — HYDROMORPHONE HCL 1 MG/ML IJ SOLN: 0.5000 mg | INTRAMUSCULAR | Status: DC | PRN

## 2021-11-14 MED ORDER — ENOXAPARIN SODIUM 40 MG/0.4ML IJ SOSY
40.0000 mg | PREFILLED_SYRINGE | Freq: Every day | INTRAMUSCULAR | Status: DC
  Administered 2021-11-15: 40 mg via SUBCUTANEOUS
  Filled 2021-11-14: qty 0.4

## 2021-11-14 MED ORDER — OXYCODONE HCL 5 MG/5ML PO SOLN: 5.0000 mg | Freq: Once | ORAL | Status: DC | PRN

## 2021-11-14 MED ORDER — ROCURONIUM BROMIDE 10 MG/ML (PF) SYRINGE
PREFILLED_SYRINGE | INTRAVENOUS | Status: AC
  Filled 2021-11-14: qty 10

## 2021-11-14 MED ORDER — DEXAMETHASONE SODIUM PHOSPHATE 10 MG/ML IJ SOLN
INTRAMUSCULAR | Status: AC
  Filled 2021-11-14: qty 1

## 2021-11-14 MED ORDER — FENTANYL CITRATE (PF) 250 MCG/5ML IJ SOLN
INTRAMUSCULAR | Status: AC
  Filled 2021-11-14: qty 5

## 2021-11-14 MED ORDER — FENTANYL CITRATE (PF) 100 MCG/2ML IJ SOLN
25.0000 ug | INTRAMUSCULAR | Status: DC | PRN
  Administered 2021-11-14: 50 ug via INTRAVENOUS

## 2021-11-14 MED ORDER — ACETAMINOPHEN 10 MG/ML IV SOLN: 1000.0000 mg | Freq: Once | INTRAVENOUS | Status: DC | PRN

## 2021-11-14 MED ORDER — AMISULPRIDE (ANTIEMETIC) 5 MG/2ML IV SOLN: 10.0000 mg | Freq: Once | INTRAVENOUS | Status: AC | PRN

## 2021-11-14 MED ORDER — ONDANSETRON HCL 4 MG/2ML IJ SOLN
4.0000 mg | Freq: Once | INTRAMUSCULAR | Status: AC
  Administered 2021-11-14: 4 mg via INTRAVENOUS
  Filled 2021-11-14: qty 2

## 2021-11-14 MED ORDER — DOCUSATE SODIUM 100 MG PO CAPS
100.0000 mg | ORAL_CAPSULE | Freq: Two times a day (BID) | ORAL | Status: DC
  Administered 2021-11-15: 100 mg via ORAL
  Filled 2021-11-14 (×2): qty 1

## 2021-11-14 MED ORDER — ACETAMINOPHEN 10 MG/ML IV SOLN
INTRAVENOUS | Status: AC
  Filled 2021-11-14: qty 100

## 2021-11-14 MED ORDER — SODIUM CHLORIDE 0.9 % IR SOLN
Status: DC | PRN
  Administered 2021-11-14: 1000 mL

## 2021-11-14 MED ORDER — MORPHINE SULFATE (PF) 4 MG/ML IV SOLN
4.0000 mg | Freq: Once | INTRAVENOUS | Status: AC
  Administered 2021-11-14: 4 mg via INTRAVENOUS
  Filled 2021-11-14: qty 1

## 2021-11-14 MED ORDER — BUPIVACAINE-EPINEPHRINE (PF) 0.25% -1:200000 IJ SOLN
INTRAMUSCULAR | Status: AC
  Filled 2021-11-14: qty 30

## 2021-11-14 MED ORDER — MIDAZOLAM HCL 2 MG/2ML IJ SOLN
INTRAMUSCULAR | Status: AC
  Filled 2021-11-14: qty 2

## 2021-11-14 MED ORDER — ACETAMINOPHEN 325 MG PO TABS
325.0000 mg | ORAL_TABLET | Freq: Four times a day (QID) | ORAL | Status: DC
  Administered 2021-11-15 (×2): 325 mg via ORAL
  Filled 2021-11-14 (×3): qty 1

## 2021-11-14 MED ORDER — PHENYLEPHRINE 80 MCG/ML (10ML) SYRINGE FOR IV PUSH (FOR BLOOD PRESSURE SUPPORT)
PREFILLED_SYRINGE | INTRAVENOUS | Status: AC
  Filled 2021-11-14: qty 10

## 2021-11-14 MED ORDER — CHLORHEXIDINE GLUCONATE 0.12 % MT SOLN: 15.0000 mL | Freq: Once | OROMUCOSAL | Status: AC

## 2021-11-14 MED ORDER — LIDOCAINE 2% (20 MG/ML) 5 ML SYRINGE
INTRAMUSCULAR | Status: AC
  Filled 2021-11-14: qty 5

## 2021-11-14 MED ORDER — CHLORHEXIDINE GLUCONATE 0.12 % MT SOLN
OROMUCOSAL | Status: AC
  Administered 2021-11-14: 15 mL via OROMUCOSAL
  Filled 2021-11-14: qty 15

## 2021-11-14 MED ORDER — AMISULPRIDE (ANTIEMETIC) 5 MG/2ML IV SOLN
INTRAVENOUS | Status: AC
  Administered 2021-11-14: 10 mg via INTRAVENOUS
  Filled 2021-11-14: qty 4

## 2021-11-14 MED ORDER — ACETAMINOPHEN 160 MG/5ML PO SOLN: 325.0000 mg | ORAL | Status: DC | PRN

## 2021-11-14 MED ORDER — DIPHENHYDRAMINE HCL 12.5 MG/5ML PO ELIX: 12.5000 mg | ORAL_SOLUTION | Freq: Four times a day (QID) | ORAL | Status: DC | PRN

## 2021-11-14 MED ORDER — ROCURONIUM BROMIDE 10 MG/ML (PF) SYRINGE
PREFILLED_SYRINGE | INTRAVENOUS | Status: DC | PRN
  Administered 2021-11-14: 50 mg via INTRAVENOUS

## 2021-11-14 MED ORDER — PHENYLEPHRINE 80 MCG/ML (10ML) SYRINGE FOR IV PUSH (FOR BLOOD PRESSURE SUPPORT)
PREFILLED_SYRINGE | INTRAVENOUS | Status: DC | PRN
  Administered 2021-11-14: 80 ug via INTRAVENOUS

## 2021-11-14 MED ORDER — DIPHENHYDRAMINE HCL 50 MG/ML IJ SOLN: 12.5000 mg | Freq: Four times a day (QID) | INTRAMUSCULAR | Status: DC | PRN

## 2021-11-14 MED ORDER — ALBUTEROL SULFATE HFA 108 (90 BASE) MCG/ACT IN AERS
INHALATION_SPRAY | RESPIRATORY_TRACT | Status: DC | PRN
  Administered 2021-11-14: 6 via RESPIRATORY_TRACT

## 2021-11-14 MED ORDER — ORAL CARE MOUTH RINSE: 15.0000 mL | Freq: Once | OROMUCOSAL | Status: AC

## 2021-11-14 MED ORDER — METRONIDAZOLE 500 MG/100ML IV SOLN
500.0000 mg | Freq: Once | INTRAVENOUS | Status: AC
  Administered 2021-11-14: 500 mg via INTRAVENOUS
  Filled 2021-11-14: qty 100

## 2021-11-14 MED ORDER — MORPHINE SULFATE (PF) 4 MG/ML IV SOLN
4.0000 mg | INTRAVENOUS | Status: DC | PRN
  Administered 2021-11-14: 4 mg via INTRAVENOUS
  Filled 2021-11-14: qty 1

## 2021-11-14 MED ORDER — 0.9 % SODIUM CHLORIDE (POUR BTL) OPTIME
TOPICAL | Status: DC | PRN
  Administered 2021-11-14: 1000 mL

## 2021-11-14 MED ORDER — SODIUM CHLORIDE 0.9 % IV BOLUS
1000.0000 mL | Freq: Once | INTRAVENOUS | Status: AC
  Administered 2021-11-14: 1000 mL via INTRAVENOUS

## 2021-11-14 MED ORDER — BUPIVACAINE HCL (PF) 0.25 % IJ SOLN
INTRAMUSCULAR | Status: AC
  Filled 2021-11-14: qty 30

## 2021-11-14 MED ORDER — FENTANYL CITRATE (PF) 250 MCG/5ML IJ SOLN
INTRAMUSCULAR | Status: DC | PRN
  Administered 2021-11-14 (×3): 50 ug via INTRAVENOUS

## 2021-11-14 MED ORDER — LACTATED RINGERS IV SOLN: INTRAVENOUS | Status: DC

## 2021-11-14 MED ORDER — METHOCARBAMOL 1000 MG/10ML IJ SOLN: 500.0000 mg | Freq: Three times a day (TID) | INTRAVENOUS | Status: DC | PRN

## 2021-11-14 MED ORDER — FENTANYL CITRATE (PF) 100 MCG/2ML IJ SOLN
INTRAMUSCULAR | Status: AC
  Filled 2021-11-14: qty 2

## 2021-11-14 MED ORDER — BUPIVACAINE-EPINEPHRINE 0.25% -1:200000 IJ SOLN
INTRAMUSCULAR | Status: DC | PRN
  Administered 2021-11-14: 30 mL

## 2021-11-14 MED ORDER — ONDANSETRON 4 MG PO TBDP: 4.0000 mg | ORAL_TABLET | Freq: Four times a day (QID) | ORAL | Status: DC | PRN

## 2021-11-14 MED ORDER — HYDROCODONE-ACETAMINOPHEN 5-325 MG PO TABS: 1.0000 | ORAL_TABLET | ORAL | Status: DC | PRN

## 2021-11-14 MED ORDER — PROPOFOL 10 MG/ML IV BOLUS
INTRAVENOUS | Status: AC
  Filled 2021-11-14: qty 20

## 2021-11-14 MED ORDER — IOHEXOL 350 MG/ML SOLN
70.0000 mL | Freq: Once | INTRAVENOUS | Status: AC | PRN
  Administered 2021-11-14: 70 mL via INTRAVENOUS

## 2021-11-14 MED ORDER — SODIUM CHLORIDE 0.9 % IV SOLN
2.0000 g | Freq: Once | INTRAVENOUS | Status: AC
  Administered 2021-11-14: 2 g via INTRAVENOUS
  Filled 2021-11-14: qty 20

## 2021-11-14 MED ORDER — ALBUTEROL SULFATE (2.5 MG/3ML) 0.083% IN NEBU: 2.5000 mg | INHALATION_SOLUTION | Freq: Once | RESPIRATORY_TRACT | Status: AC

## 2021-11-14 MED ORDER — PROPOFOL 10 MG/ML IV BOLUS
INTRAVENOUS | Status: DC | PRN
  Administered 2021-11-14: 140 mg via INTRAVENOUS

## 2021-11-14 MED ORDER — DEXAMETHASONE SODIUM PHOSPHATE 10 MG/ML IJ SOLN
INTRAMUSCULAR | Status: DC | PRN
  Administered 2021-11-14: 5 mg via INTRAVENOUS

## 2021-11-14 MED ORDER — OXYCODONE HCL 5 MG PO TABS: 5.0000 mg | ORAL_TABLET | Freq: Once | ORAL | Status: DC | PRN

## 2021-11-14 MED ORDER — ONDANSETRON HCL 4 MG/2ML IJ SOLN
INTRAMUSCULAR | Status: AC
  Filled 2021-11-14: qty 2

## 2021-11-14 MED ORDER — ACETAMINOPHEN 10 MG/ML IV SOLN
INTRAVENOUS | Status: DC | PRN
  Administered 2021-11-14: 1000 mg via INTRAVENOUS

## 2021-11-14 SURGICAL SUPPLY — 59 items
ADH SKN CLS APL DERMABOND .7 (GAUZE/BANDAGES/DRESSINGS) ×1
APL PRP STRL LF DISP 70% ISPRP (MISCELLANEOUS) ×1
APL SKNCLS STERI-STRIP NONHPOA (GAUZE/BANDAGES/DRESSINGS) ×1
APPLIER CLIP ROT 10 11.4 M/L (STAPLE)
APR CLP MED LRG 11.4X10 (STAPLE)
BAG COUNTER SPONGE SURGICOUNT (BAG) ×1 IMPLANT
BAG SPEC RTRVL LRG 6X4 10 (ENDOMECHANICALS) ×1
BAG SPNG CNTER NS LX DISP (BAG) ×1
BENZOIN TINCTURE PRP APPL 2/3 (GAUZE/BANDAGES/DRESSINGS) ×1 IMPLANT
BLADE CLIPPER SURG (BLADE) IMPLANT
CANISTER SUCT 3000ML PPV (MISCELLANEOUS) IMPLANT
CHLORAPREP W/TINT 26 (MISCELLANEOUS) ×1 IMPLANT
CLIP APPLIE ROT 10 11.4 M/L (STAPLE) IMPLANT
COVER SURGICAL LIGHT HANDLE (MISCELLANEOUS) ×1 IMPLANT
CUTTER FLEX LINEAR 45M (STAPLE) ×1 IMPLANT
DERMABOND ADVANCED .7 DNX12 (GAUZE/BANDAGES/DRESSINGS) IMPLANT
DRSG TEGADERM 2-3/8X2-3/4 SM (GAUZE/BANDAGES/DRESSINGS) ×2 IMPLANT
DRSG TEGADERM 4X4.75 (GAUZE/BANDAGES/DRESSINGS) ×1 IMPLANT
ELECT REM PT RETURN 9FT ADLT (ELECTROSURGICAL) ×1
ELECTRODE REM PT RTRN 9FT ADLT (ELECTROSURGICAL) ×1 IMPLANT
ENDOLOOP SUT PDS II  0 18 (SUTURE) ×1
ENDOLOOP SUT PDS II 0 18 (SUTURE) IMPLANT
GAUZE SPONGE 2X2 8PLY STRL LF (GAUZE/BANDAGES/DRESSINGS) ×1 IMPLANT
GLOVE BIO SURGEON STRL SZ7 (GLOVE) ×1 IMPLANT
GLOVE BIOGEL PI IND STRL 7.5 (GLOVE) ×1 IMPLANT
GOWN STRL REUS W/ TWL LRG LVL3 (GOWN DISPOSABLE) ×3 IMPLANT
GOWN STRL REUS W/TWL LRG LVL3 (GOWN DISPOSABLE) ×3
GRASPER SUT TROCAR 14GX15 (MISCELLANEOUS) IMPLANT
IRRIG SUCT STRYKERFLOW 2 WTIP (MISCELLANEOUS) ×1
IRRIGATION SUCT STRKRFLW 2 WTP (MISCELLANEOUS) IMPLANT
KIT BASIN OR (CUSTOM PROCEDURE TRAY) ×1 IMPLANT
KIT TURNOVER KIT B (KITS) ×1 IMPLANT
NDL INSUFFLATION 14GA 120MM (NEEDLE) IMPLANT
NEEDLE INSUFFLATION 14GA 120MM (NEEDLE) ×1 IMPLANT
NS IRRIG 1000ML POUR BTL (IV SOLUTION) ×1 IMPLANT
PAD ARMBOARD 7.5X6 YLW CONV (MISCELLANEOUS) ×2 IMPLANT
POUCH SPECIMEN RETRIEVAL 10MM (ENDOMECHANICALS) ×1 IMPLANT
RELOAD STAPLE 45 3.5 BLU ETS (ENDOMECHANICALS) ×1 IMPLANT
RELOAD STAPLE TA45 3.5 REG BLU (ENDOMECHANICALS) ×2 IMPLANT
SCISSORS LAP 5X35 DISP (ENDOMECHANICALS) IMPLANT
SET IRRIG TUBING LAPAROSCOPIC (IRRIGATION / IRRIGATOR) IMPLANT
SET TUBE SMOKE EVAC HIGH FLOW (TUBING) ×1 IMPLANT
SHEARS HARMONIC ACE PLUS 36CM (ENDOMECHANICALS) ×1 IMPLANT
SLEEVE ENDOPATH XCEL 5M (ENDOMECHANICALS) ×1 IMPLANT
SLEEVE Z-THREAD 5X100MM (TROCAR) IMPLANT
SPECIMEN JAR SMALL (MISCELLANEOUS) ×1 IMPLANT
STRIP CLOSURE SKIN 1/2X4 (GAUZE/BANDAGES/DRESSINGS) ×1 IMPLANT
SUT MNCRL AB 4-0 PS2 18 (SUTURE) ×1 IMPLANT
SYS BAG RETRIEVAL 10MM (BASKET) ×1
SYSTEM BAG RETRIEVAL 10MM (BASKET) IMPLANT
TOWEL GREEN STERILE (TOWEL DISPOSABLE) ×1 IMPLANT
TOWEL GREEN STERILE FF (TOWEL DISPOSABLE) ×1 IMPLANT
TRAY LAPAROSCOPIC MC (CUSTOM PROCEDURE TRAY) ×1 IMPLANT
TROCAR BALLN 12MMX100 BLUNT (TROCAR) IMPLANT
TROCAR XCEL BLUNT TIP 100MML (ENDOMECHANICALS) ×1 IMPLANT
TROCAR Z THREAD OPTICAL 12X100 (TROCAR) IMPLANT
TROCAR Z-THREAD OPTICAL 5X100M (TROCAR) ×1 IMPLANT
WARMER LAPAROSCOPE (MISCELLANEOUS) IMPLANT
WATER STERILE IRR 1000ML POUR (IV SOLUTION) ×1 IMPLANT

## 2021-11-14 NOTE — ED Triage Notes (Signed)
Pt. Stated, Tara Peterson had generalized abdominal pain RLQ  pain. 2 episodes of vomiting.

## 2021-11-14 NOTE — Discharge Instructions (Signed)
[facility]  Please arrive at least 30 min before your appointment to complete your check in paperwork.  If you are unable to arrive 30 min prior to your appointment time we may have to cancel or reschedule you. LAPAROSCOPIC SURGERY: POST OP INSTRUCTIONS Always review your discharge instruction sheet given to you by the facility where your surgery was performed. IF YOU HAVE DISABILITY OR FAMILY LEAVE FORMS, YOU MUST BRING THEM TO THE OFFICE FOR PROCESSING.   DO NOT GIVE THEM TO YOUR DOCTOR.  PAIN CONTROL  First take acetaminophen (Tylenol) AND/or ibuprofen (Advil) to control your pain after surgery.  Follow directions on package.  Taking acetaminophen (Tylenol) and/or ibuprofen (Advil) regularly after surgery will help to control your pain and lower the amount of prescription pain medication you may need.  You should not take more than 4,000 mg (4 grams) of acetaminophen (Tylenol) in 24 hours.  You should not take ibuprofen (Advil), aleve, motrin, naprosyn or other NSAIDS if you have a history of stomach ulcers or chronic kidney disease.  A prescription for pain medication may be given to you upon discharge.  Take your pain medication as prescribed, if you still have uncontrolled pain after taking acetaminophen (Tylenol) or ibuprofen (Advil). Use ice packs to help control pain. If you need a refill on your pain medication, please contact your pharmacy.  They will contact our office to request authorization. Prescriptions will not be filled after 5pm or on week-ends.  HOME MEDICATIONS Take your usually prescribed medications unless otherwise directed.  DIET You should follow a light diet the first few days after arrival home.  Be sure to include lots of fluids daily. Avoid fatty, fried foods.   CONSTIPATION It is common to experience some constipation after surgery and if you are taking pain medication.  Increasing fluid intake and taking a stool softener (such as Colace)  will usually help or prevent this problem from occurring.  A mild laxative (Milk of Magnesia or Miralax) should be taken according to package instructions if there are no bowel movements after 48 hours.  WOUND/INCISION CARE Most patients will experience some swelling and bruising in the area of the incisions.  Ice packs will help.  Swelling and bruising can take several days to resolve.  Unless discharge instructions indicate otherwise, follow guidelines below  STERI-STRIPS - you may remove your outer bandages 48 hours after surgery, and you may shower at that time.  You have steri-strips (small skin tapes) in place directly over the incision.  These strips should be left on the skin for 7-10 days.   DERMABOND/SKIN GLUE - you may shower in 24 hours.  The glue will flake off over the next 2-3 weeks. Any sutures or staples will be removed at the office during your follow-up visit.  ACTIVITIES You may resume regular (light) daily activities beginning the next day--such as daily self-care, walking, climbing stairs--gradually increasing activities as tolerated.  You may have sexual intercourse when it is comfortable.  Refrain from any heavy lifting or straining until approved by your doctor. You may drive when you are no longer taking prescription pain medication, you can comfortably wear a seatbelt, and you can safely maneuver your car and apply brakes.  FOLLOW-UP You should see your doctor in the office for a follow-up appointment approximately 2-3 weeks after your surgery.  You should have been given your post-op/follow-up appointment when your surgery was scheduled.  If you did not receive a post-op/follow-up appointment, make sure   that you call for this appointment within a day or two after you arrive home to insure a convenient appointment time.  OTHER INSTRUCTIONS  WHEN TO CALL YOUR DOCTOR: Fever over 101.0 Inability to urinate Continued bleeding from incision. Increased pain, redness, or  drainage from the incision. Increasing abdominal pain  The clinic staff is available to answer your questions during regular business hours.  Please don't hesitate to call and ask to speak to one of the nurses for clinical concerns.  If you have a medical emergency, go to the nearest emergency room or call 911.  A surgeon from [facility] is always on call at the hospital. [address], [address], [place], [place]  [address] ? [address] [address], [place], [place]   [address] [phone] ? [phone] ? FAX [phone]   

## 2021-11-14 NOTE — Op Note (Signed)
Date: 11/14/21  Patient: Tara Peterson MRN: [id number]  Preoperative Diagnosis: Acute appendicitis Postoperative Diagnosis: Same  Procedure: Laparoscopic appendectomy  Surgeon: Michaelle Birks, MD  EBL: Minimal  Anesthesia: General endotracheal  Specimens: Appendix  Indications: [name] is a 66 yo female who presented to the ED with acute abdominal pain that began about two days ago. Symptoms gradually worsened, and this morning her pain localized to the right lower quadrant. A CT scan showed acute appendicitis with multiple appendicoliths. After a discussion of the risks and benefits of surgery, the patient agreed to proceed with appendectomy.  Findings: Acute appendicitis without perforation or abscess.  Procedure details: Informed consent was obtained in the preoperative area prior to the procedure. The patient was brought to the operating room and placed on the table in the supine position. General anesthesia was induced and appropriate lines and drains were placed for intraoperative monitoring. Perioperative antibiotics were administered per SCIP guidelines. The abdomen was prepped and draped in the usual sterile fashion. A pre-procedure timeout was taken verifying patient identity, surgical site and procedure to be performed.  A small infraumbilical skin incision was made and the subcutaneous tissue was spread to expose the fascia. The umbilical stalk was grasped and elevated, and a Veress needle was inserted through the fascia. Intraperitoneal placement was confirmed with the saline drop test and the abdomen was insufflated. A 4mm port was placed, and the abdomen was inspected with no evidence of visceral or vascular injury. A suprapubic 40mm port was placed about two fingerbreadths superior to the pubic symphysis, followed by a 27mm port in the LLQ, both under direct visualization. Significant inflammation was noted in the RLQ, with adherence of a loop of small bowel to the abdominal  sidewall. The cecum and appendix were not immediately visible. The small bowel was gently separated from the abdominal wall with blunt dissection. This exposed the appendix, which was very inflamed and dilated. The mesoappendix was also very thickened. The cecum was normal and healthy in appearance. The appendix was mobilized off the abdominal sidewall using Harmonic shears, taking care not to injure the cecum. The mesoappendix was divided with the Harmonic. The appendix was divided at the base from the cecum using a 62mm stapler with a blue load. The base of the appendix felt soft and healthy prior to applying the stapler. The specimen was placed in an endocatch bag and removed and sent for routine pathology. The surgical site was irrigated. The staple line was inspected and appeared in tact with no bleeding or leakage. The cut surface of the mesoappendix was hemostatic. The terminal ileum was closely examined where it had been adherent to the abdominal sidewall, and the serosa appeared completely in tact with no signs of injury. The umbilical port site fascia was closed with a 0 Vicryl suture using a PMI. The ports were removed and the pneumoperitoneum was evacuated. The skin at all port sites was closed with 4-0 monocryl subcuticular suture. Dermabond was applied.  The patient tolerated the procedure well with no apparent complications. All counts were correct x2 at the end of the procedure. The patient was extubated and taken to PACU in stable condition.  Michaelle Birks, MD 11/14/21 10:09 PM

## 2021-11-14 NOTE — Anesthesia Preprocedure Evaluation (Addendum)
Anesthesia Evaluation  Patient identified by MRN, date of birth, ID band Patient awake    Reviewed: Allergy & Precautions, NPO status , Patient's Chart, lab work & pertinent test results  Airway Mallampati: II       Dental   Pulmonary neg pulmonary ROS   breath sounds clear to auscultation       Cardiovascular negative cardio ROS  Rhythm:Regular Rate:Normal     Neuro/Psych negative neurological ROS     GI/Hepatic Neg liver ROS,,,Hx noted Dr. Nyoka Cowden   Endo/Other  negative endocrine ROS    Renal/GU negative Renal ROS     Musculoskeletal   Abdominal   Peds  Hematology   Anesthesia Other Findings   Reproductive/Obstetrics                             Anesthesia Physical Anesthesia Plan  ASA: 1 and emergent  Anesthesia Plan:    Post-op Pain Management: Tylenol PO (pre-op)*   Induction:   PONV Risk Score and Plan: 3 and Ondansetron, Dexamethasone and Midazolam  Airway Management Planned: Oral ETT  Additional Equipment:   Intra-op Plan:   Post-operative Plan: Extubation in OR  Informed Consent: I have reviewed the patients History and Physical, chart, labs and discussed the procedure including the risks, benefits and alternatives for the proposed anesthesia with the patient or authorized representative who has indicated his/her understanding and acceptance.     Dental advisory given  Plan Discussed with: Anesthesiologist and CRNA  Anesthesia Plan Comments:        Anesthesia Quick Evaluation

## 2021-11-14 NOTE — ED Provider Notes (Signed)
[facility] EMERGENCY DEPARTMENT Provider Note   CSN: [id number] Arrival date & time: 11/14/21  2703     History  Chief Complaint  Patient presents with   Abdominal Pain   Nausea   Emesis    Tara Peterson is a 66 y.o. female.  Tara Peterson is a 66 y.o. female with a history of seasonal allergies, otherwise healthy, who presents to the emergency department with complaints of abdominal pain.  Patient reports on Tuesday she was having some generalized abdominal discomfort and soreness that she had attributed to gas or soreness from doing lots of yard work.  It seemed to be getting a bit better yesterday but when she woke up at 4 AM this morning with worsening abdominal pain that was now localized to the right lower quadrant.  Pain has been constant, not improved with walking or massaging the area.  She reports this pain feels worse than any prior gas pains she has had.  She has had 2 episodes of vomiting.  Last ate or drink last night.  No diarrhea or constipation.  Denies any dysuria or hematuria.  History of previous hysterectomy no other abdominal surgeries.  The history is provided by the patient and medical records.  Abdominal Pain Associated symptoms: vomiting   Associated symptoms: no chills, no dysuria, no fever and no hematuria   Emesis Associated symptoms: abdominal pain   Associated symptoms: no chills and no fever        Home Medications Prior to Admission medications   Medication Sig Start Date End Date Taking? Authorizing Provider  HYDROcodone-acetaminophen (NORCO) 5-325 MG tablet Take 1-2 tablets by mouth every 6 (six) hours as needed. 01/18/18   Geoffery Lyons, MD      Allergies    Patient has no known allergies.    Review of Systems   Review of Systems  Constitutional:  Negative for chills and fever.  Gastrointestinal:  Positive for abdominal pain and vomiting.  Genitourinary:  Negative for dysuria, frequency and hematuria.  All other  systems reviewed and are negative.   Physical Exam Updated Vital Signs BP 125/75 (BP Location: Left Arm)   Pulse 79   Temp 98.2 F (36.8 C) (Oral)   Resp 17   Ht 5\' 7"  (1.702 m)   Wt 104.3 kg   SpO2 95%   BMI 36.02 kg/m  Physical Exam Vitals and nursing note reviewed.  Constitutional:      General: She is not in acute distress.    Appearance: Normal appearance. She is well-developed. She is not diaphoretic.  HENT:     Head: Normocephalic and atraumatic.  Eyes:     General:        Right eye: No discharge.        Left eye: No discharge.     Pupils: Pupils are equal, round, and reactive to light.  Cardiovascular:     Rate and Rhythm: Normal rate and regular rhythm.     Pulses: Normal pulses.     Heart sounds: Normal heart sounds.  Pulmonary:     Effort: Pulmonary effort is normal. No respiratory distress.     Breath sounds: Normal breath sounds. No wheezing or rales.     Comments: Respirations equal and unlabored, patient able to speak in full sentences, lungs clear to auscultation bilaterally  Abdominal:     General: Bowel sounds are normal. There is no distension.     Palpations: Abdomen is soft. There is no mass.  Tenderness: There is abdominal tenderness in the right lower quadrant. There is guarding. Positive signs include McBurney's sign.     Comments: Abdomen soft, nondistended, focal right lower quadrant tenderness with some guarding, positive tenderness at McBurney's point  Musculoskeletal:        General: No deformity.     Cervical back: Neck supple.  Skin:    General: Skin is warm and dry.     Capillary Refill: Capillary refill takes less than 2 seconds.  Neurological:     Mental Status: She is alert and oriented to person, place, and time.     Coordination: Coordination normal.     Comments: Speech is clear, able to follow commands Moves extremities without ataxia, coordination intact  Psychiatric:        Mood and Affect: Mood normal.        Behavior:  Behavior normal.     ED Results / Procedures / Treatments   Labs (all labs ordered are listed, but only abnormal results are displayed) Labs Reviewed  COMPREHENSIVE METABOLIC PANEL - Abnormal; Notable for the following components:      Result Value   CO2 21 (*)    Glucose, Bld 127 (*)    All other components within normal limits  CBC WITH DIFFERENTIAL/PLATELET - Abnormal; Notable for the following components:   WBC 14.5 (*)    Neutro Abs 11.8 (*)    Abs Immature Granulocytes 0.23 (*)    All other components within normal limits  LIPASE, BLOOD  URINALYSIS, ROUTINE W REFLEX MICROSCOPIC    EKG None  Radiology CT Abdomen Pelvis W Contrast  Result Date: 11/14/2021 CLINICAL DATA:  Right lower quadrant pain EXAM: CT ABDOMEN AND PELVIS WITH CONTRAST TECHNIQUE: Multidetector CT imaging of the abdomen and pelvis was performed using the standard protocol following bolus administration of intravenous contrast. RADIATION DOSE REDUCTION: This exam was performed according to the departmental dose-optimization program which includes automated exposure control, adjustment of the mA and/or kV according to patient size and/or use of iterative reconstruction technique. CONTRAST:  77mL OMNIPAQUE IOHEXOL 350 MG/ML SOLN COMPARISON:  CT AP 01/18/18 FINDINGS: Lower chest: No acute abnormality. Hepatobiliary: Liver has a normal contour. No focal liver lesions are seen. Hepatic and portal veins are patent. Evidence of intra or extrahepatic biliary ductal dilatation. The gallbladder is normal in appearance. Pancreas: Unremarkable. No pancreatic ductal dilatation or surrounding inflammatory changes. Spleen: Normal in size without focal abnormality. Adrenals/Urinary Tract: There is a 1.2 x 1.2 cm nonenhancing lesion in the left adrenal gland, unchanged from 2019, and favored to represent adrenal adenoma. Bilateral kidneys are normal in size without evidence of hydronephrosis. There is a hypodense lesion of the upper  pole of the right kidney which is unchanged from 2019, and likely a renal cyst. No evidence of nephrolithiasis. The urinary bladder is fluid-filled without focal wall thickening. Stomach/Bowel: The stomach, small bowel, large bowel are normal in caliber. No evidence of bowel obstruction. There is diverticulosis without diverticulitis. There is mild nonspecific thickening of the distal sigmoid colon, which may be due to underdistention (series 3, image 72). The appendix is dilated measuring up to 1.5 cm with surrounding fat stranding. There is also a 5 mm appendicolith proximally. Vascular/Lymphatic: No significant vascular findings are present. No enlarged abdominal or pelvic lymph nodes. Reproductive: Status post hysterectomy. No adnexal masses. Other: No abdominal wall hernia or abnormality. No abdominopelvic ascites. Musculoskeletal: Severe right and moderate left neural foraminal narrowing at L5-S1. IMPRESSION: 1. Findings are compatible with  acute appendicitis. No evidence of perforation or abscess. 2. There is mild nonspecific thickening of the distal sigmoid colon, likely due to underdistention. Correlate for symptoms of diarrhea. Electronically Signed   By: Marin Roberts M.D.   On: 11/14/2021 12:47    Procedures Procedures    Medications Ordered in ED Medications  ondansetron (ZOFRAN) injection 4 mg ( Intravenous MAR Hold 11/14/21 1558)  morphine (PF) 4 MG/ML injection 4 mg ( Intravenous MAR Hold 11/14/21 1558)  0.9 %  sodium chloride infusion (has no administration in time range)  lactated ringers infusion ( Intravenous New Bag/Given 11/14/21 1616)  iohexol (OMNIPAQUE) 350 MG/ML injection 70 mL (70 mLs Intravenous Contrast Given 11/14/21 1227)  cefTRIAXone (ROCEPHIN) 2 g in sodium chloride 0.9 % 100 mL IVPB (0 g Intravenous Stopped 11/14/21 1519)    And  metroNIDAZOLE (FLAGYL) IVPB 500 mg (0 mg Intravenous Stopped 11/14/21 1547)  ondansetron (ZOFRAN) injection 4 mg (4 mg Intravenous Given  11/14/21 1436)  morphine (PF) 4 MG/ML injection 4 mg (4 mg Intravenous Given 11/14/21 1437)  sodium chloride 0.9 % bolus 1,000 mL (0 mLs Intravenous Stopped 11/14/21 1518)  chlorhexidine (PERIDEX) 0.12 % solution 15 mL (15 mLs Mouth/Throat Given 11/14/21 1616)    Or  Oral care mouth rinse ( Mouth Rinse See Alternative 11/14/21 1616)    ED Course/ Medical Decision Making/ A&P                           Medical Decision Making Amount and/or Complexity of Data Reviewed Labs: ordered. Radiology: ordered.  Risk Prescription drug management.   66 y.o. female presents to the ED with complaints of right lower quadrant abdominal pain, this involves an extensive number of treatment options, and is a complaint that carries with it a high risk of complications and morbidity.  The differential diagnosis includes appendicitis, cholecystitis, diverticulitis, ovarian cyst, ovarian torsion, UTI, renal stone, obstruction, perforation, abscess  On arrival pt is nontoxic, vitals WNL.   Additional history obtained from medical record. Previous records obtained and reviewed   I ordered medication including IV fluid bolus, morphine and Zofran for symptomatic management  Lab Tests:  I Ordered, reviewed, and interpreted labs, which included: Leukocytosis of 14.5, normal hemoglobin, glucose of 127, no other significant electrolyte derangements, normal renal and liver function, normal lipase, UA without hematuria or signs of infection  Imaging Studies ordered:  I ordered imaging studies which included CT abdomen pelvis, I independently visualized and interpreted imaging which showed acute uncomplicated appendicitis  ED Course:   Patient's pain improving with treatment in the emergency department.  I discussed CT results and diagnosis of appendicitis.  Patient given IV Rocephin and Flagyl for antibiotic coverage.  Recommend admission for appendectomy, patient expresses understanding and is in agreement with  this plan  I consulted general surgery and discussed lab and imaging findings with surgery PA Enid Cutter, they will see and admit the patient for appendectomy.    Portions of this note were generated with [employer]. Dictation errors may occur despite best attempts at proofreading.         Final Clinical Impression(s) / ED Diagnoses Final diagnoses:  Acute appendicitis, uncomplicated    Rx / DC Orders ED Discharge Orders     None         Janet Berlin 11/14/21 1841    Elgie Congo, MD 11/14/21 2216

## 2021-11-14 NOTE — Interval H&P Note (Signed)
History and Physical Interval Note:  11/14/2021 5:05 PM  Tara Peterson  has presented today for surgery, with the diagnosis of acute appendicitis.  The various methods of treatment have been discussed with the patient and family. After consideration of risks, benefits and other options for treatment, the patient has consented to  Procedure(s): APPENDECTOMY LAPAROSCOPIC (N/A) as a surgical intervention.  The patient's history has been reviewed, patient examined, no change in status, stable for surgery.  I have reviewed the patient's chart and labs.  Questions were answered to the patient's satisfaction.  I met with the patient in preop and reviewed the details of the planned procedure.   Dwan Bolt

## 2021-11-14 NOTE — H&P (Signed)
[facility] Admission Note  Tara Peterson [birth year]  [id number].    Requesting MD: Vivien Rossetti Chief Complaint/Reason for Consult: appendicitis   HPI:  Tara Peterson is a 66 y.o. female PMH obesity BMI 36 and seasonal allergies who presented to [facility] today for evaluation of abdominal pain. States that she had some gas pains 2 days ago, which is somewhat normal for her. Yesterday she developed some back pain after working in the yard, then this morning woke up around 4am with acute onset right sided abdominal pain. Pain gradually worsening and severe. Associated with nausea and vomiting. Last BM yesterday morning. Denies fever, chills, dysuria.  In the ED she was found to be hemodynamically stable. Lab work pertinent for WBC 14.5. CT scan abdomen/ pelvis shows a dilated appendix up to 1.5 cm with surrounding fat stranding and appendicolith. General surgery asked to see.  Abdominal surgical history: hysterectomy Anticoagulants: none Former smoker, quit in 2017  Denies alcohol or illicit drug use Employment: retired Engineer, civil (consulting) Lives at home alone. Son is on his way to the hospital  No family history on file.  Past Medical History:  Diagnosis Date   Thyroid disease     Past Surgical History:  Procedure Laterality Date   ABDOMINAL HYSTERECTOMY  1993    Social History:  reports that she has quit smoking. She quit smokeless tobacco use about 13 years ago. She reports that she does not drink alcohol and does not use drugs.  Allergies: No Known Allergies  (Not in a hospital admission)   Prior to Admission medications   Medication Sig Start Date End Date Taking? Authorizing Provider  HYDROcodone-acetaminophen (NORCO) 5-325 MG tablet Take 1-2 tablets by mouth every 6 (six) hours as needed. 01/18/18   Geoffery Lyons, MD    Blood pressure (!) 123/104, pulse 84, temperature 98.2 F (36.8 C), temperature source Oral, resp. rate (!) 21, height 5\' 7"  (1.702 m), weight 104.3 kg,  SpO2 97 %. Physical Exam: General: pleasant, WD/WN female who is laying in bed in NAD HEENT: head is normocephalic, atraumatic.  Sclera are noninjected.  Pupils equal and round.  Ears and nose without any masses or lesions.  Mouth is pink and moist. Dentition fair Heart: regular, rate, and rhythm Lungs: CTAB, no wheezes, rhonchi, or rales noted.  Respiratory effort nonlabored Abd: soft, nondistended, +BS, no masses, hernias, or organomegaly. Focally tender in the RLQ without rebound or guarding, no peritonitis MS: no BUE/BLE edema, calves soft and nontender Skin: warm and dry with no masses, lesions, or rashes Psych: A&Ox4 with an appropriate affect Neuro: MAEs, no gross motor or sensory deficits BUE/BLE  Results for orders placed or performed during the hospital encounter of 11/14/21 (from the past 48 hour(s))  Comprehensive metabolic panel     Status: Abnormal   Collection Time: 11/14/21 10:05 AM  Result Value Ref Range   Sodium 137 135 - 145 mmol/L   Potassium 4.3 3.5 - 5.1 mmol/L   Chloride 107 98 - 111 mmol/L   CO2 21 (L) 22 - 32 mmol/L   Glucose, Bld 127 (H) 70 - 99 mg/dL    Comment: Glucose reference range applies only to samples taken after fasting for at least 8 hours.   BUN 12 8 - 23 mg/dL   Creatinine, Ser 01/14/22 0.44 - 1.00 mg/dL   Calcium 9.2 8.9 - 9.92 mg/dL   Total Protein 7.2 6.5 - 8.1 g/dL   Albumin 4.2 3.5 - 5.0 g/dL   AST 20 15 -  41 U/L   ALT 25 0 - 44 U/L   Alkaline Phosphatase 57 38 - 126 U/L   Total Bilirubin 0.6 0.3 - 1.2 mg/dL   GFR, Estimated >29 >93 mL/min    Comment: (NOTE) Calculated using the CKD-EPI Creatinine Equation (2021)    Anion gap 9 5 - 15    Comment: Performed at [facility], [address], [place], [place] [address]  Lipase, blood     Status: None   Collection Time: 11/14/21 10:05 AM  Result Value Ref Range   Lipase 32 11 - 51 U/L    Comment: Performed at [facility], [address] [place], [place] [address]  CBC with  Differential     Status: Abnormal   Collection Time: 11/14/21 10:05 AM  Result Value Ref Range   WBC 14.5 (H) 4.0 - 10.5 K/uL   RBC 4.11 3.87 - 5.11 MIL/uL   Hemoglobin 12.9 12.0 - 15.0 g/dL   HCT 10.1 75.1 - 02.5 %   MCV 93.9 80.0 - 100.0 fL   MCH 31.4 26.0 - 34.0 pg   MCHC 33.4 30.0 - 36.0 g/dL   RDW 85.2 77.8 - 24.2 %   Platelets 287 150 - 400 K/uL   nRBC 0.0 0.0 - 0.2 %   Neutrophils Relative % 81 %   Neutro Abs 11.8 (H) 1.7 - 7.7 K/uL   Lymphocytes Relative 11 %   Lymphs Abs 1.5 0.7 - 4.0 K/uL   Monocytes Relative 6 %   Monocytes Absolute 0.8 0.1 - 1.0 K/uL   Eosinophils Relative 0 %   Eosinophils Absolute 0.0 0.0 - 0.5 K/uL   Basophils Relative 0 %   Basophils Absolute 0.0 0.0 - 0.1 K/uL   Immature Granulocytes 2 %   Abs Immature Granulocytes 0.23 (H) 0.00 - 0.07 K/uL    Comment: Performed at [facility], [address], [place], [place] [address]  Urinalysis, Routine w reflex microscopic     Status: None   Collection Time: 11/14/21 10:44 AM  Result Value Ref Range   Color, Urine YELLOW YELLOW   APPearance CLEAR CLEAR   Specific Gravity, Urine 1.018 1.005 - 1.030   pH 6.0 5.0 - 8.0   Glucose, UA NEGATIVE NEGATIVE mg/dL   Hgb urine dipstick NEGATIVE NEGATIVE   Bilirubin Urine NEGATIVE NEGATIVE   Ketones, ur NEGATIVE NEGATIVE mg/dL   Protein, ur NEGATIVE NEGATIVE mg/dL   Nitrite NEGATIVE NEGATIVE   Leukocytes,Ua NEGATIVE NEGATIVE    Comment: Performed at [facility], [address], [place], [place] [address]   CT Abdomen Pelvis W Contrast  Result Date: 11/14/2021 CLINICAL DATA:  Right lower quadrant pain EXAM: CT ABDOMEN AND PELVIS WITH CONTRAST TECHNIQUE: Multidetector CT imaging of the abdomen and pelvis was performed using the standard protocol following bolus administration of intravenous contrast. RADIATION DOSE REDUCTION: This exam was performed according to the departmental dose-optimization program which includes automated exposure control,  adjustment of the mA and/or kV according to patient size and/or use of iterative reconstruction technique. CONTRAST:  8mL OMNIPAQUE IOHEXOL 350 MG/ML SOLN COMPARISON:  CT AP 01/18/18 FINDINGS: Lower chest: No acute abnormality. Hepatobiliary: Liver has a normal contour. No focal liver lesions are seen. Hepatic and portal veins are patent. Evidence of intra or extrahepatic biliary ductal dilatation. The gallbladder is normal in appearance. Pancreas: Unremarkable. No pancreatic ductal dilatation or surrounding inflammatory changes. Spleen: Normal in size without focal abnormality. Adrenals/Urinary Tract: There is a  1.2 x 1.2 cm nonenhancing lesion in the left adrenal gland, unchanged from 2019, and favored to represent adrenal adenoma. Bilateral kidneys are normal in size without evidence of hydronephrosis. There is a hypodense lesion of the upper pole of the right kidney which is unchanged from 2019, and likely a renal cyst. No evidence of nephrolithiasis. The urinary bladder is fluid-filled without focal wall thickening. Stomach/Bowel: The stomach, small bowel, large bowel are normal in caliber. No evidence of bowel obstruction. There is diverticulosis without diverticulitis. There is mild nonspecific thickening of the distal sigmoid colon, which may be due to underdistention (series 3, image 72). The appendix is dilated measuring up to 1.5 cm with surrounding fat stranding. There is also a 5 mm appendicolith proximally. Vascular/Lymphatic: No significant vascular findings are present. No enlarged abdominal or pelvic lymph nodes. Reproductive: Status post hysterectomy. No adnexal masses. Other: No abdominal wall hernia or abnormality. No abdominopelvic ascites. Musculoskeletal: Severe right and moderate left neural foraminal narrowing at L5-S1. IMPRESSION: 1. Findings are compatible with acute appendicitis. No evidence of perforation or abscess. 2. There is mild nonspecific thickening of the distal sigmoid colon,  likely due to underdistention. Correlate for symptoms of diarrhea. Electronically Signed   By: Marin Roberts M.D.   On: 11/14/2021 12:47      Assessment/Plan Acute appendicitis - Patient with clinical and radiographic findings consistent with acute appendicitis. CT scan abdomen/ pelvis shows a dilated appendix up to 1.5 cm with surrounding fat stranding and appendicolith; no signs of abscess or perforation. Recommend laparoscopic appendectomy. I have discussed the procedure and risks of appendectomy. The risks include but are not limited to bleeding, infection, anesthesia, injury to intra-abdominal organs, possibility of open procedure and ileocecectomy, and postoperative ileus. She seems to understand and agrees with the plan. She is NPO today. Rocephin/flagyl started in the ED. Plan for surgery later today. Possible discharge home from PACU after the procedure.    Obesity BMI 36 Seasonal allergies Former smoker, quit in 2017  I reviewed ED provider notes, last 24 h vitals and pain scores, last 48 h intake and output, last 24 h labs and trends, and last 24 h imaging results.   Wellington Hampshire, [facility] 11/14/2021, 3:29 PM Please see Amion for pager number during day hours 7:00am-4:30pm

## 2021-11-14 NOTE — Transfer of Care (Signed)
Immediate Anesthesia Transfer of Care Note  Patient: Tara Peterson  Procedure(s) Performed: APPENDECTOMY LAPAROSCOPIC (Abdomen)  Patient Location: PACU  Anesthesia Type:General  Level of Consciousness: awake and alert   Airway & Oxygen Therapy: Patient Spontanous Breathing  Post-op Assessment: Report given to RN and Post -op Vital signs reviewed and stable  Post vital signs: Reviewed and stable  Last Vitals:  Vitals Value Taken Time  BP 158/80 11/14/21 2150  Temp    Pulse 98 11/14/21 2156  Resp 28 11/14/21 2156  SpO2 92 % 11/14/21 2156  Vitals shown include unvalidated device data.  Last Pain:  Vitals:   11/14/21 1600  TempSrc:   PainSc: 0-No pain         Complications: No notable events documented.

## 2021-11-14 NOTE — ED Provider Triage Note (Signed)
Emergency Medicine Provider Triage Evaluation Note  Tara Peterson , a 66 y.o. female  was evaluated in triage.  Pt complains of abdominal pain.  Patient reports on Tuesday she was having some generalized abdominal discomfort and soreness that she had attributed to gas.  It seemed to be getting a bit better but when she woke up at 4 AM this morning she had persistent right lower quadrant abdominal pain.  Pain has been constant, not improved with walking or massaging the area.  She has had 2 episodes of vomiting.  No diarrhea or constipation.  Denies any dysuria or hematuria.  Review of Systems  Positive: Abdominal pain, nausea, vomiting Negative: Fevers, constipation, diarrhea, dysuria, hematuria  Physical Exam  Ht 5\' 7"  (1.702 m)   Wt 104.3 kg   BMI 36.02 kg/m  Gen:   Awake, no distress   Resp:  Normal effort  MSK:   Moves extremities without difficulty  Other:  Focal right lower quadrant pain with some guarding, no peritoneal signs  Medical Decision Making  Medically screening exam initiated at 9:46 AM.  Appropriate orders placed.  Tara Peterson was informed that the remainder of the evaluation will be completed by another provider, this initial triage assessment does not replace that evaluation, and the importance of remaining in the ED until their evaluation is complete.  Concern for possible appendicitis, labs and CT ordered.   Tara Peterson, Tara Peterson 11/14/21 [phone]

## 2021-11-14 NOTE — Anesthesia Procedure Notes (Signed)
Procedure Name: Intubation Date/Time: 11/14/2021 8:31 PM  Performed by: Reece Agar, CRNAPre-anesthesia Checklist: Patient identified, Emergency Drugs available, Suction available and Patient being monitored Patient Re-evaluated:Patient Re-evaluated prior to induction Oxygen Delivery Method: Circle System Utilized Preoxygenation: Pre-oxygenation with 100% oxygen Induction Type: IV induction Ventilation: Mask ventilation without difficulty Laryngoscope Size: Mac and 3 Grade View: Grade I Tube type: Oral Number of attempts: 1 Airway Equipment and Method: Stylet Placement Confirmation: ETT inserted through vocal cords under direct vision, positive ETCO2 and breath sounds checked- equal and bilateral Secured at: 22 cm Tube secured with: Tape Dental Injury: Teeth and Oropharynx as per pre-operative assessment

## 2021-11-15 ENCOUNTER — Encounter (HOSPITAL_COMMUNITY): Payer: Self-pay | Admitting: Surgery

## 2021-11-15 DIAGNOSIS — K3580 Unspecified acute appendicitis: Secondary | ICD-10-CM | POA: Diagnosis not present

## 2021-11-15 DIAGNOSIS — R1084 Generalized abdominal pain: Secondary | ICD-10-CM | POA: Diagnosis not present

## 2021-11-15 MED ORDER — INFLUENZA VAC A&B SA ADJ QUAD 0.5 ML IM PRSY: 0.5000 mL | PREFILLED_SYRINGE | INTRAMUSCULAR | Status: DC

## 2021-11-15 MED ORDER — ACETAMINOPHEN 500 MG PO TABS: 500.0000 mg | ORAL_TABLET | Freq: Four times a day (QID) | ORAL | 0 refills | Status: AC | PRN

## 2021-11-15 MED ORDER — DOCUSATE SODIUM 100 MG PO CAPS: 100.0000 mg | ORAL_CAPSULE | Freq: Two times a day (BID) | ORAL | 0 refills | Status: AC

## 2021-11-15 NOTE — TOC Initial Note (Signed)
Transition of Care [facility]) - Initial/Assessment Note    Patient Details  Name: Tara Peterson MRN: [id number] Date of Birth: March 22, 1955  Transition of [facility] CM/SW Contact:    Ninfa Meeker, RN Phone Number: 11/15/2021, 10:02 AM  Clinical Narrative:    Transition of Care Screening Note: S/P appendectomy  Transition of Care Department [facility]) has reviewed patient and no TOC needs have been identified at this time. We will continue to monitor patient advancement through Interdisciplinary progressions. If new patient transition needs arise, please place a consult.                        Patient Goals and CMS Choice        Expected Discharge Plan and Services                                                Prior Living Arrangements/Services                       Activities of Daily Living Home Assistive Devices/Equipment: None ADL Screening (condition at time of admission) Patient's cognitive ability adequate to safely complete daily activities?: Yes Is the patient deaf or have difficulty hearing?: No Does the patient have difficulty seeing, even when wearing glasses/contacts?: No Does the patient have difficulty concentrating, remembering, or making decisions?: No Patient able to express need for assistance with ADLs?: Yes Does the patient have difficulty dressing or bathing?: No Independently performs ADLs?: Yes (appropriate for developmental age) Does the patient have difficulty walking or climbing stairs?: No Weakness of Legs: None Weakness of Arms/Hands: None  Permission Sought/Granted                  Emotional Assessment              Admission diagnosis:  Acute appendicitis, uncomplicated [U27.25] Status post laparoscopic appendectomy [Z90.49] Acute appendicitis [K35.80] Patient Active Problem List   Diagnosis Date Noted   Status post laparoscopic appendectomy 11/14/2021   Acute appendicitis 11/14/2021   PCP:  Lorene Dy, MD Pharmacy:   [place], [place] [address] Phone: [phone] Fax: [phone]     Social Determinants of Health (SDOH) Interventions    Readmission Risk Interventions     No data to display

## 2021-11-15 NOTE — Plan of Care (Signed)

## 2021-11-15 NOTE — Discharge Summary (Addendum)
[facility] Discharge Summary   Patient ID: Tara Peterson MRN: [id number] DOB/AGE: February 23, 1955 66 y.o.  Admit date: 11/14/2021 Discharge date: 11/15/2021  Admitting Diagnosis: appendicits  Discharge Diagnosis Patient Active Problem List   Diagnosis Date Noted   Status post laparoscopic appendectomy 11/14/2021   Acute appendicitis 11/14/2021    Consultants None   Imaging: CT Abdomen Pelvis W Contrast  Result Date: 11/14/2021 CLINICAL DATA:  Right lower quadrant pain EXAM: CT ABDOMEN AND PELVIS WITH CONTRAST TECHNIQUE: Multidetector CT imaging of the abdomen and pelvis was performed using the standard protocol following bolus administration of intravenous contrast. RADIATION DOSE REDUCTION: This exam was performed according to the departmental dose-optimization program which includes automated exposure control, adjustment of the mA and/or kV according to patient size and/or use of iterative reconstruction technique. CONTRAST:  85mL OMNIPAQUE IOHEXOL 350 MG/ML SOLN COMPARISON:  CT AP 01/18/18 FINDINGS: Lower chest: No acute abnormality. Hepatobiliary: Liver has a normal contour. No focal liver lesions are seen. Hepatic and portal veins are patent. Evidence of intra or extrahepatic biliary ductal dilatation. The gallbladder is normal in appearance. Pancreas: Unremarkable. No pancreatic ductal dilatation or surrounding inflammatory changes. Spleen: Normal in size without focal abnormality. Adrenals/Urinary Tract: There is a 1.2 x 1.2 cm nonenhancing lesion in the left adrenal gland, unchanged from 2019, and favored to represent adrenal adenoma. Bilateral kidneys are normal in size without evidence of hydronephrosis. There is a hypodense lesion of the upper pole of the right kidney which is unchanged from 2019, and likely a renal cyst. No evidence of nephrolithiasis. The urinary bladder is fluid-filled without focal wall thickening. Stomach/Bowel: The stomach, small bowel, large bowel  are normal in caliber. No evidence of bowel obstruction. There is diverticulosis without diverticulitis. There is mild nonspecific thickening of the distal sigmoid colon, which may be due to underdistention (series 3, image 72). The appendix is dilated measuring up to 1.5 cm with surrounding fat stranding. There is also a 5 mm appendicolith proximally. Vascular/Lymphatic: No significant vascular findings are present. No enlarged abdominal or pelvic lymph nodes. Reproductive: Status post hysterectomy. No adnexal masses. Other: No abdominal wall hernia or abnormality. No abdominopelvic ascites. Musculoskeletal: Severe right and moderate left neural foraminal narrowing at L5-S1. IMPRESSION: 1. Findings are compatible with acute appendicitis. No evidence of perforation or abscess. 2. There is mild nonspecific thickening of the distal sigmoid colon, likely due to underdistention. Correlate for symptoms of diarrhea. Electronically Signed   By: Lorenza Cambridge M.D.   On: 11/14/2021 12:47    Procedures Dr. Sophronia Simas (11/14/21)- Laparoscopic Appendectomy  Hospital Course:  66 y/o F who presented to [facility] with RLQ pain.  Workup showed appendicitis.  Patient was admitted and underwent procedure listed above.  Tolerated procedure well and was transferred to the floor.  Diet was advanced as tolerated.  On POD#1, the patient was voiding well, tolerating diet, ambulating well, pain well controlled, vital signs stable, incisions c/d/i and felt stable for discharge home.  Patient will follow up in our office as below and knows to call with questions or concerns.    Physical Exam: General:  Alert, NAD, pleasant, comfortable Abd:  Soft, ND, mild tenderness, mild distention, incisions C/D/I  Allergies as of 11/15/2021   No Known Allergies      Medication List     TAKE these medications    acetaminophen 500 MG tablet Commonly known as: TYLENOL Take 1-2 tablets (500-1,000 mg total) by mouth every 6 (six) hours as  needed for mild  pain. What changed: when to take this   aspirin EC 81 MG tablet Take 81 mg by mouth daily.   CARNATION INSTANT BREAKFAST PO Take 1 each by mouth daily. Chocolate   docusate sodium 100 MG capsule Commonly known as: COLACE Take 1 capsule (100 mg total) by mouth 2 (two) times daily.   ibuprofen 200 MG tablet Commonly known as: ADVIL Take 400 mg by mouth 2 (two) times daily as needed for mild pain.   loratadine 10 MG tablet Commonly known as: CLARITIN Take 10 mg by mouth daily.   OVER THE COUNTER MEDICATION Take 2 capsules by mouth daily. [facility], [place]. Call.   Specialty: General Surgery Why: We are working on your appointment,call to confirm, Arrive 67min early to check in, fill out paperwork, Engineer, civil (consulting) ID and Doctor, general practice information: [address] [place] [phone]                Signed: Obie Dredge, [facility] 11/15/2021, 1:43 PM

## 2021-11-15 NOTE — Anesthesia Postprocedure Evaluation (Signed)
Anesthesia Post Note  Patient: Tara Peterson  Procedure(s) Performed: APPENDECTOMY LAPAROSCOPIC (Abdomen)     Patient location during evaluation: PACU Anesthesia Type: General Level of consciousness: awake and alert Pain management: pain level controlled Vital Signs Assessment: post-procedure vital signs reviewed and stable Respiratory status: spontaneous breathing, nonlabored ventilation, respiratory function stable and patient connected to nasal cannula oxygen Cardiovascular status: blood pressure returned to baseline and stable Postop Assessment: no apparent nausea or vomiting Anesthetic complications: no   No notable events documented.  Last Vitals:  Vitals:   11/14/21 2315 11/14/21 2340  BP: 117/63 134/72  Pulse: 86 84  Resp: 20 18  Temp:  36.9 C  SpO2: 94% 93%    Last Pain:  Vitals:   11/14/21 2340  TempSrc: Oral  PainSc:                  Effie Berkshire

## 2021-11-15 NOTE — Progress Notes (Deleted)
[facility] Discharge Summary   Patient ID: Tara Peterson MRN: [id number] DOB/AGE: 66-Oct-1957 66 y.o.  Admit date: 11/14/2021 Discharge date: 11/15/2021  Admitting Diagnosis: appendicits  Discharge Diagnosis Patient Active Problem List   Diagnosis Date Noted   Status post laparoscopic appendectomy 11/14/2021   Acute appendicitis 11/14/2021    Consultants None   Imaging: CT Abdomen Pelvis W Contrast  Result Date: 11/14/2021 CLINICAL DATA:  Right lower quadrant pain EXAM: CT ABDOMEN AND PELVIS WITH CONTRAST TECHNIQUE: Multidetector CT imaging of the abdomen and pelvis was performed using the standard protocol following bolus administration of intravenous contrast. RADIATION DOSE REDUCTION: This exam was performed according to the departmental dose-optimization program which includes automated exposure control, adjustment of the mA and/or kV according to patient size and/or use of iterative reconstruction technique. CONTRAST:  55mL OMNIPAQUE IOHEXOL 350 MG/ML SOLN COMPARISON:  CT AP 01/18/18 FINDINGS: Lower chest: No acute abnormality. Hepatobiliary: Liver has a normal contour. No focal liver lesions are seen. Hepatic and portal veins are patent. Evidence of intra or extrahepatic biliary ductal dilatation. The gallbladder is normal in appearance. Pancreas: Unremarkable. No pancreatic ductal dilatation or surrounding inflammatory changes. Spleen: Normal in size without focal abnormality. Adrenals/Urinary Tract: There is a 1.2 x 1.2 cm nonenhancing lesion in the left adrenal gland, unchanged from 2019, and favored to represent adrenal adenoma. Bilateral kidneys are normal in size without evidence of hydronephrosis. There is a hypodense lesion of the upper pole of the right kidney which is unchanged from 2019, and likely a renal cyst. No evidence of nephrolithiasis. The urinary bladder is fluid-filled without focal wall thickening. Stomach/Bowel: The stomach, small bowel, large bowel  are normal in caliber. No evidence of bowel obstruction. There is diverticulosis without diverticulitis. There is mild nonspecific thickening of the distal sigmoid colon, which may be due to underdistention (series 3, image 72). The appendix is dilated measuring up to 1.5 cm with surrounding fat stranding. There is also a 5 mm appendicolith proximally. Vascular/Lymphatic: No significant vascular findings are present. No enlarged abdominal or pelvic lymph nodes. Reproductive: Status post hysterectomy. No adnexal masses. Other: No abdominal wall hernia or abnormality. No abdominopelvic ascites. Musculoskeletal: Severe right and moderate left neural foraminal narrowing at L5-S1. IMPRESSION: 1. Findings are compatible with acute appendicitis. No evidence of perforation or abscess. 2. There is mild nonspecific thickening of the distal sigmoid colon, likely due to underdistention. Correlate for symptoms of diarrhea. Electronically Signed   By: Lorenza Cambridge M.D.   On: 11/14/2021 12:47    Procedures Dr. Sophronia Simas (11/14/21)- Laparoscopic Appendectomy  Hospital Course:  66 y/o F who presented to [facility] with RLQ pain.  Workup showed appendicitis.  Patient was admitted and underwent procedure listed above.  Tolerated procedure well and was transferred to the floor.  Diet was advanced as tolerated.  On POD#1, the patient was voiding well, tolerating diet, ambulating well, pain well controlled, vital signs stable, incisions c/d/i and felt stable for discharge home.  Patient will follow up in our office as below weeks and knows to call with questions or concerns.    Physical Exam: General:  Alert, NAD, pleasant, comfortable Abd:  Soft, ND, mild tenderness, mild distention, incisions C/D/I  Allergies as of 11/15/2021   No Known Allergies      Medication List     TAKE these medications    acetaminophen 500 MG tablet Commonly known as: TYLENOL Take 1-2 tablets (500-1,000 mg total) by mouth every 6 (six)  hours as needed for  mild pain. What changed: when to take this   aspirin EC 81 MG tablet Take 81 mg by mouth daily.   CARNATION INSTANT BREAKFAST PO Take 1 each by mouth daily. Chocolate   docusate sodium 100 MG capsule Commonly known as: COLACE Take 1 capsule (100 mg total) by mouth 2 (two) times daily.   ibuprofen 200 MG tablet Commonly known as: ADVIL Take 400 mg by mouth 2 (two) times daily as needed for mild pain.   loratadine 10 MG tablet Commonly known as: CLARITIN Take 10 mg by mouth daily.   OVER THE COUNTER MEDICATION Take 2 capsules by mouth daily. [facility], [place]. Call.   Specialty: General Surgery Why: We are working on your appointment,call to confirm, Arrive 33min early to check in, fill out paperwork, Engineer, civil (consulting) ID and Doctor, general practice information: [address] [place] [phone]                Signed: Obie Dredge, [facility] 11/15/2021, 12:08 PM

## 2021-11-18 LAB — SURGICAL PATHOLOGY

## 2023-08-01 ENCOUNTER — Encounter (HOSPITAL_BASED_OUTPATIENT_CLINIC_OR_DEPARTMENT_OTHER): Payer: Self-pay

## 2023-08-01 ENCOUNTER — Emergency Department (HOSPITAL_BASED_OUTPATIENT_CLINIC_OR_DEPARTMENT_OTHER)
Admission: EM | Admit: 2023-08-01 | Discharge: 2023-08-01 | Disposition: A | Discharge status: Home / Self Care | Attending: Emergency Medicine | Admitting: Emergency Medicine

## 2023-08-01 ENCOUNTER — Other Ambulatory Visit: Payer: Self-pay

## 2023-08-01 DIAGNOSIS — L259 Unspecified contact dermatitis, unspecified cause: Secondary | ICD-10-CM | POA: Diagnosis not present

## 2023-08-01 DIAGNOSIS — R21 Rash and other nonspecific skin eruption: Secondary | ICD-10-CM | POA: Diagnosis present

## 2023-08-01 DIAGNOSIS — Z7982 Long term (current) use of aspirin: Secondary | ICD-10-CM | POA: Diagnosis not present

## 2023-08-01 MED ORDER — DEXAMETHASONE SODIUM PHOSPHATE 10 MG/ML IJ SOLN
10.0000 mg | Freq: Once | INTRAMUSCULAR | Status: AC
  Administered 2023-08-01: 10 mg via INTRAVENOUS
  Filled 2023-08-01: qty 1

## 2023-08-01 MED ORDER — PREDNISONE 20 MG PO TABS: ORAL_TABLET | ORAL | 0 refills | Status: AC

## 2023-08-01 NOTE — ED Triage Notes (Signed)
 Rash going down bilateral arms and into face (red raised spots), itchy, non painful beginning last night and getting worse today. Pt has history of bad rashes from poison ivy and poison oak but states they have never gotten this bad. Does feel similar to breakouts from poison ivy and poison oak. [place]. Per email regarding recent case of measles. [place] individual with confirmed case. NAD noted.

## 2023-08-01 NOTE — ED Provider Notes (Signed)
 [facility]  Provider Note  CSN: [id number] Arrival date & time: 08/01/23 9960  History Chief Complaint  Patient presents with   Rash    Tara Peterson is a 68 y.o. female with history of significant reactions to poison ivy, etc reports itching rash on her arms that has spread to her face today. She has not had any URI symptoms. She has been working in the yard recently but is typically very cautious to avoid contact.    Home Medications Prior to Admission medications   Medication Sig Start Date End Date Taking? Authorizing Provider  predniSONE (DELTASONE) 20 MG tablet 3 Tabs PO Days 1-3, then 2 tabs PO Days 4-6, then 1 tab PO Day 7-9, then Half Tab PO Day 10-12 08/01/23  Yes Roselyn Carlin NOVAK, MD  acetaminophen  (TYLENOL ) 500 MG tablet Take 1-2 tablets (500-1,000 mg total) by mouth every 6 (six) hours as needed for mild pain. 11/15/21   Augustus Almarie RAMAN, PA-C  aspirin EC 81 MG tablet Take 81 mg by mouth daily.    [provider]  docusate sodium  (COLACE) 100 MG capsule Take 1 capsule (100 mg total) by mouth 2 (two) times daily. 11/15/21   Augustus Almarie RAMAN, PA-C  ibuprofen (ADVIL) 200 MG tablet Take 400 mg by mouth 2 (two) times daily as needed for mild pain.    [provider]  loratadine (CLARITIN) 10 MG tablet Take 10 mg by mouth daily.    [provider]  Nutritional Supplements (CARNATION INSTANT BREAKFAST PO) Take 1 each by mouth daily. Chocolate    [provider]  OVER THE COUNTER MEDICATION Take 2 capsules by mouth daily. [facility] [employer]    [provider]     Allergies    Patient has no known allergies.   Review of Systems   Review of Systems Please see HPI for pertinent positives and negatives  Physical Exam BP (!) 196/84 (BP Location: Right Arm) Comment: Simultaneous filing. User may not have seen previous data.  Pulse 92 Comment: Simultaneous filing.  User may not have seen previous data.  Temp 97.6 F (36.4 C) (Oral)   Resp 20 Comment: Simultaneous filing. User may not have seen previous data.  Ht 5' 7 (1.702 m)   Wt 95.3 kg   SpO2 100% Comment: Simultaneous filing. User may not have seen previous data.  BMI 32.89 kg/m   Physical Exam Vitals and nursing note reviewed.  HENT:     Head: Normocephalic.     Nose: Nose normal.   Eyes:     Extraocular Movements: Extraocular movements intact.   Pulmonary:     Effort: Pulmonary effort is normal.   Musculoskeletal:        General: Normal range of motion.     Cervical back: Neck supple.   Skin:    Findings: Rash (urticarial rash to forearms and face) present.   Neurological:     Mental Status: She is alert and oriented to person, place, and time.   Psychiatric:        Mood and Affect: Mood normal.     ED Results / Procedures / Treatments   EKG None  Procedures Procedures  Medications Ordered in the ED Medications  dexamethasone  (DECADRON ) injection 10 mg (has no administration in time range)    Initial Impression and Plan  Patient here for itchy rash since yesterday. She had been at the [employer] recently and so was  initially placed in isolation as a possible measles exposure. Fortunately, her trip to the [employer] predated the recent measles case in this area. Her rash is not consistent with measles and she is fully vaccinated. Will treat with IM and oral steroids, oral antihistamines as needed. PCP follow up, RTED for any other concerns.    ED Course       MDM Rules/Calculators/A&P Medical Decision Making Problems Addressed: Contact dermatitis, unspecified contact dermatitis type, unspecified trigger: acute illness or injury  Risk Prescription drug management.     Final Clinical Impression(s) / ED Diagnoses Final diagnoses:  Contact dermatitis, unspecified contact dermatitis type, unspecified trigger    Rx / DC Orders ED Discharge  Orders          Ordered    predniSONE (DELTASONE) 20 MG tablet        08/01/23 0211             Roselyn Carlin NOVAK, MD 08/01/23 [phone]
